# Patient Record
Sex: Female | Born: 2000
Health system: Southern US, Community
[De-identification: ages and names within clinical notes are randomized; demographics above are authoritative.]

## PROBLEM LIST (undated history)

## (undated) DIAGNOSIS — D802 Selective deficiency of immunoglobulin A [IgA]: Secondary | ICD-10-CM

## (undated) DIAGNOSIS — K900 Celiac disease: Secondary | ICD-10-CM

## (undated) DIAGNOSIS — J45909 Unspecified asthma, uncomplicated: Secondary | ICD-10-CM

## (undated) DIAGNOSIS — L309 Dermatitis, unspecified: Secondary | ICD-10-CM

## (undated) DIAGNOSIS — T7840XA Allergy, unspecified, initial encounter: Secondary | ICD-10-CM

## (undated) HISTORY — PX: TONSILLECTOMY AND ADENOIDECTOMY: SUR1326

## (undated) HISTORY — PX: ADENOIDECTOMY: SUR15

## (undated) HISTORY — DX: Dermatitis, unspecified: L30.9

---

## 2020-05-02 ENCOUNTER — Encounter (HOSPITAL_COMMUNITY): Payer: Self-pay | Admitting: Emergency Medicine

## 2020-05-02 ENCOUNTER — Ambulatory Visit (HOSPITAL_COMMUNITY)
Admission: EM | Admit: 2020-05-02 | Discharge: 2020-05-02 | Disposition: A | Discharge status: Home / Self Care | Payer: 59

## 2020-05-02 ENCOUNTER — Other Ambulatory Visit: Payer: Self-pay

## 2020-05-02 ENCOUNTER — Ambulatory Visit (HOSPITAL_COMMUNITY): Admit: 2020-05-02 | Discharge status: Absent without leave | Payer: Self-pay | Source: Home / Self Care

## 2020-05-02 DIAGNOSIS — N921 Excessive and frequent menstruation with irregular cycle: Secondary | ICD-10-CM

## 2020-05-02 DIAGNOSIS — G43109 Migraine with aura, not intractable, without status migrainosus: Secondary | ICD-10-CM | POA: Diagnosis not present

## 2020-05-02 DIAGNOSIS — H6983 Other specified disorders of Eustachian tube, bilateral: Secondary | ICD-10-CM

## 2020-05-02 HISTORY — DX: Unspecified asthma, uncomplicated: J45.909

## 2020-05-02 HISTORY — DX: Celiac disease: K90.0

## 2020-05-02 HISTORY — DX: Selective deficiency of immunoglobulin a (iga): D80.2

## 2020-05-02 HISTORY — DX: Allergy, unspecified, initial encounter: T78.40XA

## 2020-05-02 MED ORDER — PREDNISONE 50 MG PO TABS: ORAL_TABLET | ORAL | 0 refills | Status: DC

## 2020-05-02 MED ORDER — PROPRANOLOL HCL 10 MG PO TABS: 10.0000 mg | ORAL_TABLET | Freq: Two times a day (BID) | ORAL | 0 refills | Status: DC

## 2020-05-02 NOTE — ED Triage Notes (Addendum)
Pt c/o right ear pain and itching x 3 days ago. Pt states it feels like it is popping like on an airplane. Pt also states she is having migraines and dizziness before each period and heavy bleeding. She is not currently on her period but is unable to see her PCP until the end of October. Patient states she has the Nexpl[place] implant that was placed in May.

## 2020-05-02 NOTE — ED Provider Notes (Signed)
[facility]    CSN: [id number] Arrival date & time: 05/02/20  1521      History   Chief Complaint Chief Complaint  Patient presents with  . Otalgia  . Menstrual Problem    HPI Barbara Torres is a 19 y.o. female.   Feels popping and pressure and itching in the right ear x 3 days. So far has tried a dose of benadryl and sudafed without relief. Does have a hx of seasonal allergies.   Had the nexplanon placed back in April which initially took her menstrual cycle away until last month when she had severe migraine with aura, dizziness and a heavy cycle several days later. States migraines started when she started menstruating, estrogen birth control makes them much worse. Has tried excedrin and triptans without much relief and worse side effects than the migraine. Right now having migraines about twice a month.       Past Medical History:  Diagnosis Date  . Allergies   . Asthma   . Celiac disease   . IgA deficiency (HCC)     There are no problems to display for this patient.   Past Surgical History:  Procedure Laterality Date  . TONSILLECTOMY AND ADENOIDECTOMY      OB History   No obstetric history on file.      Home Medications    Prior to Admission medications   Medication Sig Start Date End Date Taking? Authorizing Provider  cetirizine (ZYRTEC) 10 MG tablet Take 10 mg by mouth daily.   Yes [provider]  fluticasone (FLONASE) 50 MCG/ACT nasal spray Place into both nostrils daily.   Yes [provider]  montelukast (SINGULAIR) 10 MG tablet Take 10 mg by mouth at bedtime.   Yes [provider]  predniSONE (DELTASONE) 50 MG tablet Take 1 tab daily with breakfast for 3 days 05/02/20   Particia Nearing, PA-C  propranolol (INDERAL) 10 MG tablet Take 1 tablet (10 mg total) by mouth 2 (two) times daily. 05/02/20   Particia Nearing, PA-C    Family History Family History  Problem Relation Age of Onset  . Healthy  Mother   . Celiac disease Father     Social History Social History   Tobacco Use  . Smoking status: Never Smoker  . Smokeless tobacco: Never Used  Vaping Use  . Vaping Use: Never used  Substance Use Topics  . Alcohol use: Never  . Drug use: Not on file     Allergies   Sulfa antibiotics   Review of Systems Review of Systems PER HPI    Physical Exam Triage Vital Signs ED Triage Vitals  Enc Vitals Group     BP 05/02/20 1708 135/83     Pulse Rate 05/02/20 1708 80     Resp 05/02/20 1708 16     Temp 05/02/20 1708 99 F (37.2 C)     Temp Source 05/02/20 1708 Oral     SpO2 05/02/20 1708 98 %     Weight --      Height --      Head Circumference --      Peak Flow --      Pain Score 05/02/20 1703 4     Pain Loc --      Pain Edu? --      Excl. in GC? --    No data found.  Updated Vital Signs BP 135/83 (BP Location: Left Arm)   Pulse 80   Temp 99  F (37.2 C) (Oral)   Resp 16   LMP 04/13/2020   SpO2 98%   Visual Acuity Right Eye Distance:   Left Eye Distance:   Bilateral Distance:    Right Eye Near:   Left Eye Near:    Bilateral Near:     Physical Exam Vitals and nursing note reviewed.  Constitutional:      Appearance: Normal appearance. She is not ill-appearing.  HENT:     Head: Atraumatic.     Ears:     Comments: B/l clear effusion middle ears    Nose:     Comments: Nasal mucosa boggy and erythematous    Mouth/Throat:     Mouth: Mucous membranes are moist.     Pharynx: Posterior oropharyngeal erythema present.  Eyes:     Extraocular Movements: Extraocular movements intact.     Conjunctiva/sclera: Conjunctivae normal.  Cardiovascular:     Rate and Rhythm: Normal rate and regular rhythm.     Heart sounds: Normal heart sounds.  Pulmonary:     Effort: Pulmonary effort is normal.     Breath sounds: Normal breath sounds.  Abdominal:     General: Bowel sounds are normal. There is no distension.     Palpations: Abdomen is soft.     Tenderness:  There is no abdominal tenderness. There is no guarding.  Musculoskeletal:        General: Normal range of motion.     Cervical back: Normal range of motion and neck supple.  Skin:    General: Skin is warm and dry.  Neurological:     General: No focal deficit present.     Mental Status: She is alert and oriented to person, place, and time.  Psychiatric:        Mood and Affect: Mood normal.        Thought Content: Thought content normal.        Judgment: Judgment normal.      UC Treatments / Results  Labs (all labs ordered are listed, but only abnormal results are displayed) Labs Reviewed - No data to display  EKG   Radiology No results found.  Procedures Procedures (including critical care time)  Medications Ordered in UC Medications - No data to display  Initial Impression / Assessment and Plan / UC Course  I have reviewed the triage vital signs and the nursing notes.  Pertinent labs & imaging results that were available during my care of the patient were reviewed by me and considered in my medical decision making (see chart for details).     Eustachian tube dysfunction - continue good allergy regimen as before, add sudafed several times daily prn until sxs improve. Prednisone burst sent in case discomfort worsening or sudafed not resolving issue. May need further allergy control if sxs not becoming better controlled in future.   Discussed multiple migraine medication options. Does not tolerate prn options well. Agreeable to trying low dose propranolol either daily as written or on prn basis until her f/u with PCP next month. Discussed watching heart rates and to stop the medication if becoming fatigued, dizzy or passing out.   Final Clinical Impressions(s) / UC Diagnoses   Final diagnoses:  Menorrhagia with irregular cycle  Migraine with aura and without status migrainosus, not intractable  Eustachian tube dysfunction, bilateral   Discharge Instructions   None      ED Prescriptions    Medication Sig Dispense Auth. Provider   propranolol (INDERAL) 10 MG tablet Take 1  tablet (10 mg total) by mouth 2 (two) times daily. 60 tablet Roosvelt Maser [place], [place]   predniSONE (DELTASONE) 50 MG tablet Take 1 tab daily with breakfast for 3 days 3 tablet Particia Nearing, [place]     PDMP not reviewed this encounter.   Particia Nearing, [place] 05/02/20 2010

## 2020-05-28 ENCOUNTER — Ambulatory Visit: Payer: 59 | Admitting: Family Medicine

## 2020-06-02 ENCOUNTER — Other Ambulatory Visit: Payer: Self-pay

## 2020-06-02 ENCOUNTER — Ambulatory Visit (INDEPENDENT_AMBULATORY_CARE_PROVIDER_SITE_OTHER): Payer: 59 | Admitting: Family Medicine

## 2020-06-02 ENCOUNTER — Encounter: Payer: Self-pay | Admitting: Family Medicine

## 2020-06-02 VITALS — BP 114/70 | HR 86 | Ht 67.0 in | Wt 142.1 lb

## 2020-06-02 DIAGNOSIS — D802 Selective deficiency of immunoglobulin A [IgA]: Secondary | ICD-10-CM | POA: Insufficient documentation

## 2020-06-02 DIAGNOSIS — Z9109 Other allergy status, other than to drugs and biological substances: Secondary | ICD-10-CM | POA: Diagnosis not present

## 2020-06-02 DIAGNOSIS — Z23 Encounter for immunization: Secondary | ICD-10-CM

## 2020-06-02 DIAGNOSIS — K900 Celiac disease: Secondary | ICD-10-CM | POA: Insufficient documentation

## 2020-06-02 DIAGNOSIS — Z7689 Persons encountering health services in other specified circumstances: Secondary | ICD-10-CM

## 2020-06-02 DIAGNOSIS — G43109 Migraine with aura, not intractable, without status migrainosus: Secondary | ICD-10-CM | POA: Diagnosis not present

## 2020-06-02 MED ORDER — FLUTICASONE PROPIONATE 50 MCG/ACT NA SUSP: 2.0000 | Freq: Two times a day (BID) | NASAL | 2 refills | Status: DC

## 2020-06-02 NOTE — Assessment & Plan Note (Addendum)
Occur exclusively with menstrual cycle. Currently well-controlled with Propranolol 10mg  BID and Nexplanon. -Will discontinue Propranolol per patient's request -Will try Excedrin and Ibuprofen at the first sign of a migraine -Consider referral to headache center in the future as needed, since patient unable to tolerate triptans

## 2020-06-02 NOTE — Progress Notes (Signed)
    SUBJECTIVE:   CHIEF COMPLAINT / HPI:   Establish Care Patient presents to establish care. She was previously followed at [facility] in [place]. Patient recently moved to the area in August 2021 for grad school at [employer] where she is studying speech language pathology.  Health maintenance:  -Has never had Pap smear. Has never been sexually active -Would like to receive flu vaccine today -UTD on all other vaccinations, including COVID vaccine   Migraines Patient has a long hx of migraine with aura since ~2014 when she started menstruating. Patient's migraines had been occuring every month with her menstrual cycle until May 2021 when she had a Nexplanon placed. She has only gotten one period since the Nexplanon placement, and therefore has only had one migraine. She takes Propranolol 10mg  BID for prophylaxis but wishes to get off this medication since her periods/migraines are infrequent now.  Patient was previously followed by a neurologist and has tried sumatriptin, zolmitriptan, and rizatriptan, but these gave her significant neck pain. Patient states the triptans did relieve her migraines but the neck pain felt like an equal exchange. She has tried Excedrin as soon as the aura starts, which provides slight relief. Patient states she'll still have a migraine but it will be somewhat less severe.  PERTINENT  PMH / PSH: Migraine with aura, IgA deficiency, allergies, celiac disease  OBJECTIVE:   BP 114/70   Pulse 86   Ht 5\' 7"  (1.702 m)   Wt 142 lb 2 oz (64.5 kg)   LMP 04/21/2020   SpO2 99%   BMI 22.26 kg/m   Physical Exam Vitals reviewed.  Constitutional:      Appearance: Normal appearance.  HENT:     Mouth/Throat:     Mouth: Mucous membranes are moist.     Pharynx: No oropharyngeal exudate or posterior oropharyngeal erythema.  Eyes:     Pupils: Pupils are equal, round, and reactive to light.  Cardiovascular:     Rate and Rhythm: Normal rate and regular rhythm.       Heart sounds: Normal heart sounds. No murmur heard.   Pulmonary:     Effort: Pulmonary effort is normal.     Breath sounds: Normal breath sounds.  Musculoskeletal:     Cervical back: Neck supple.  Neurological:     Mental Status: She is alert and oriented to person, place, and time.     ASSESSMENT/PLAN:   Migraine with aura Occur exclusively with menstrual cycle. Currently well-controlled with Propranolol 10mg  BID and Nexplanon. -Will discontinue Propranolol per patient's request -Will try Excedrin and Ibuprofen at the first sign of a migraine -Consider referral to headache center in the future as needed, since patient unable to tolerate triptans  Health Maintenance Flu vaccine given today Patient will fax records from prior PCP [facility] Pap as patient has never been sexually active  Allergies Refills provided for Flonase   Discussed case with Dr. , MD [facility]

## 2020-06-02 NOTE — Patient Instructions (Addendum)
It was great to meet you!  Our plans for today:  - STOP taking your propanolol - Take 600mg  Ibuprofen every 8 hours and Excedrin at the first sign of a migraine - If this is not working for you, feel free to call or send a mychart message and we can consider a referral to the headache center   Take care and seek immediate care sooner if you develop any concerns.    Dr. Family Medicine

## 2020-06-27 ENCOUNTER — Ambulatory Visit (HOSPITAL_COMMUNITY)
Admission: EM | Admit: 2020-06-27 | Discharge: 2020-06-27 | Disposition: A | Discharge status: Home / Self Care | Payer: 59 | Attending: Family Medicine | Admitting: Family Medicine

## 2020-06-27 ENCOUNTER — Ambulatory Visit (HOSPITAL_COMMUNITY): Admit: 2020-06-27 | Discharge status: Absent without leave | Payer: 59

## 2020-06-27 ENCOUNTER — Encounter (HOSPITAL_COMMUNITY): Payer: Self-pay

## 2020-06-27 ENCOUNTER — Ambulatory Visit (INDEPENDENT_AMBULATORY_CARE_PROVIDER_SITE_OTHER): Payer: 59

## 2020-06-27 DIAGNOSIS — M79644 Pain in right finger(s): Secondary | ICD-10-CM | POA: Diagnosis not present

## 2020-06-27 DIAGNOSIS — S6710XA Crushing injury of unspecified finger(s), initial encounter: Secondary | ICD-10-CM | POA: Diagnosis not present

## 2020-06-27 DIAGNOSIS — S6010XA Contusion of unspecified finger with damage to nail, initial encounter: Secondary | ICD-10-CM

## 2020-06-27 DIAGNOSIS — S6991XA Unspecified injury of right wrist, hand and finger(s), initial encounter: Secondary | ICD-10-CM | POA: Diagnosis not present

## 2020-06-27 DIAGNOSIS — W231XXA Caught, crushed, jammed, or pinched between stationary objects, initial encounter: Secondary | ICD-10-CM | POA: Diagnosis not present

## 2020-06-27 NOTE — ED Provider Notes (Signed)
[facility]    CSN: [id number] Arrival date & time: 06/27/20  1528      History   Chief Complaint Chief Complaint  Patient presents with  . Finger Injury    HPI Prabhleen Montemayor is a 19 y.o. female.   Patient presenting today with right pinky pain, bruising under nail after slamming the tip of her finger into a car door 2 days ago. She states she has been having swelling, some mild numbness, and has trouble moving the distal portion of the finger. Using ice, ibuprofen which has been helping some with sxs. Denies cool extremity, swelling down into lower portion of finger or hand, disruption of nail.      Past Medical History:  Diagnosis Date  . Allergies   . Asthma   . Celiac disease   . IgA deficiency [facility])     Patient Active Problem List   Diagnosis Date Noted  . Migraine with aura 06/02/2020  . Environmental allergies 06/02/2020  . IgA deficiency (HCC) 06/02/2020  . Celiac disease 06/02/2020    Past Surgical History:  Procedure Laterality Date  . TONSILLECTOMY AND ADENOIDECTOMY      OB History   No obstetric history on file.      Home Medications    Prior to Admission medications   Medication Sig Start Date End Date Taking? Authorizing Provider  cetirizine (ZYRTEC) 10 MG tablet Take 10 mg by mouth daily.   Yes [provider]  fluticasone (FLONASE) 50 MCG/ACT nasal spray Place 2 sprays into both nostrils 2 (two) times daily. 06/02/20  Yes Maury Dus, MD  montelukast (SINGULAIR) 10 MG tablet Take 10 mg by mouth at bedtime.   Yes [provider]  propranolol (INDERAL) 10 MG tablet Take 1 tablet (10 mg total) by mouth 2 (two) times daily. 05/02/20   Particia Nearing, PA-C    Family History Family History  Problem Relation Age of Onset  . Healthy Mother   . Celiac disease Father     Social History Social History   Tobacco Use  . Smoking status: Never Smoker  . Smokeless tobacco: Never Used  Vaping Use  .  Vaping Use: Never used  Substance Use Topics  . Alcohol use: Never  . Drug use: Not on file     Allergies   Sulfa antibiotics   Review of Systems Review of Systems PER HPI    Physical Exam Triage Vital Signs ED Triage Vitals  Enc Vitals Group     BP 06/27/20 1602 114/72     Pulse Rate 06/27/20 1602 85     Resp 06/27/20 1602 16     Temp 06/27/20 1602 99.8 F (37.7 C)     Temp Source 06/27/20 1602 Oral     SpO2 06/27/20 1602 99 %     Weight --      Height --      Head Circumference --      Peak Flow --      Pain Score 06/27/20 1601 2     Pain Loc --      Pain Edu? --      Excl. in GC? --    No data found.  Updated Vital Signs BP 114/72 (BP Location: Left Arm)   Pulse 85   Temp 99.8 F (37.7 C) (Oral)   Resp 16   SpO2 99%   Visual Acuity Right Eye Distance:   Left Eye Distance:   Bilateral Distance:    Right  Eye Near:   Left Eye Near:    Bilateral Near:     Physical Exam Vitals and nursing note reviewed.  Constitutional:      Appearance: Normal appearance. She is not ill-appearing.  HENT:     Head: Atraumatic.  Eyes:     Extraocular Movements: Extraocular movements intact.     Conjunctiva/sclera: Conjunctivae normal.  Cardiovascular:     Rate and Rhythm: Normal rate and regular rhythm.     Heart sounds: Normal heart sounds.     Comments: Cap refill < 2 seconds right hand all digits Pulmonary:     Effort: Pulmonary effort is normal.     Breath sounds: Normal breath sounds.  Musculoskeletal:        General: Swelling, tenderness (ttp distal 5th digit right hand) and signs of injury present. No deformity.     Cervical back: Normal range of motion and neck supple.     Comments: ROM intact right 5th digit though stiff at DIP   Skin:    General: Skin is warm and dry.     Findings: Bruising (subungual hematoma present right 5th fingernail) present.  Neurological:     Mental Status: She is alert and oriented to person, place, and time.      Sensory: Sensory deficit (mild decreased sensation to light touch right 5th digit distally at site of injury) present.     Comments: Right hand neurovascularly intact  Psychiatric:        Mood and Affect: Mood normal.        Thought Content: Thought content normal.        Judgment: Judgment normal.    UC Treatments / Results  Labs (all labs ordered are listed, but only abnormal results are displayed) Labs Reviewed - No data to display  EKG   Radiology DG Finger Little Right  Result Date: 06/27/2020 CLINICAL DATA:  Patient states that she injured her right 5th digit in a car door x 3 days ago. Pain along distal end of finger. No Hx EXAM: RIGHT LITTLE FINGER 2+V COMPARISON:  None. FINDINGS: There is no evidence of fracture or dislocation. There is no evidence of arthropathy or other focal bone abnormality. Soft tissues are unremarkable. IMPRESSION: Negative. Electronically Signed   By: Emmaline Kluver M.D.   On: 06/27/2020 16:33    Procedures Procedures (including critical care time)  Medications Ordered in UC Medications - No data to display  Initial Impression / Assessment and Plan / UC Course  I have reviewed the triage vital signs and the nursing notes.  Pertinent labs & imaging results that were available during my care of the patient were reviewed by me and considered in my medical decision making (see chart for details).     X-ray negative for fracture, nail fully intact. Given age and mild severity of hematoma draining the area is not indicated at this time. Discussed ice, elevation, NSAIDs. Return precautions given.  Final Clinical Impressions(s) / UC Diagnoses   Final diagnoses:  Subungual hematoma of digit of hand, initial encounter  Crushing injury of finger, initial encounter   Discharge Instructions   None    ED Prescriptions    None     PDMP not reviewed this encounter.   Particia Nearing, [place] 06/27/20 1659

## 2020-06-27 NOTE — ED Triage Notes (Signed)
Pt c/o pain to right 5th finger s/p slamming it in a car door on Friday.  Unable to flex finger at distal joint, nailbed with blue/purple blood matter trapped. C/o numbness to finger.  Denies further injury. Took 400mg  ibuprofen at 1200.

## 2020-07-30 ENCOUNTER — Other Ambulatory Visit: Payer: Self-pay

## 2020-07-30 ENCOUNTER — Ambulatory Visit (HOSPITAL_COMMUNITY)
Admission: EM | Admit: 2020-07-30 | Discharge: 2020-07-30 | Disposition: A | Discharge status: Home / Self Care | Payer: BC Managed Care – PPO | Attending: Emergency Medicine | Admitting: Emergency Medicine

## 2020-07-30 ENCOUNTER — Encounter (HOSPITAL_COMMUNITY): Payer: Self-pay

## 2020-07-30 DIAGNOSIS — R21 Rash and other nonspecific skin eruption: Secondary | ICD-10-CM | POA: Diagnosis not present

## 2020-07-30 DIAGNOSIS — W57XXXA Bitten or stung by nonvenomous insect and other nonvenomous arthropods, initial encounter: Secondary | ICD-10-CM

## 2020-07-30 DIAGNOSIS — S90869A Insect bite (nonvenomous), unspecified foot, initial encounter: Secondary | ICD-10-CM | POA: Diagnosis not present

## 2020-07-30 MED ORDER — HYDROXYZINE HCL 25 MG PO TABS: 25.0000 mg | ORAL_TABLET | Freq: Four times a day (QID) | ORAL | 0 refills | Status: DC

## 2020-07-30 MED ORDER — PREDNISONE 10 MG (21) PO TBPK: ORAL_TABLET | Freq: Every day | ORAL | 0 refills | Status: DC

## 2020-07-30 NOTE — Discharge Instructions (Addendum)
Avoid scratching  May use otc anti itching cream as needed  Do not take benadryl and hydroxyzine together or while drinking

## 2020-07-30 NOTE — ED Provider Notes (Signed)
[facility]    CSN: [id number] Arrival date & time: 07/30/20  0857      History   Chief Complaint Chief Complaint  Patient presents with  . Insect Bite    HPI Barbara Torres is a 19 y.o. female.   [place] and noticed bite marks to bil arms and feet a few days ago. Has tried otc meds with no relief of itching.      Past Medical History:  Diagnosis Date  . Allergies   . Asthma   . Celiac disease   . IgA deficiency [facility])     Patient Active Problem List   Diagnosis Date Noted  . Migraine with aura 06/02/2020  . Environmental allergies 06/02/2020  . IgA deficiency (HCC) 06/02/2020  . Celiac disease 06/02/2020    Past Surgical History:  Procedure Laterality Date  . TONSILLECTOMY AND ADENOIDECTOMY      OB History   No obstetric history on file.      Home Medications    Prior to Admission medications   Medication Sig Start Date End Date Taking? Authorizing Provider  cetirizine (ZYRTEC) 10 MG tablet Take 10 mg by mouth daily.   Yes [provider]  fluticasone (FLONASE) 50 MCG/ACT nasal spray Place 2 sprays into both nostrils 2 (two) times daily. 06/02/20  Yes Maury Dus, MD  montelukast (SINGULAIR) 10 MG tablet Take 10 mg by mouth at bedtime.   Yes [provider]  hydrOXYzine (ATARAX/VISTARIL) 25 MG tablet Take 1 tablet (25 mg total) by mouth every 6 (six) hours. 07/30/20   Coralyn Mark, NP  predniSONE (STERAPRED UNI-PAK 21 TAB) 10 MG (21) TBPK tablet Take by mouth daily. Take 6 tabs by mouth daily  for 2 days, then 5 tabs for 2 days, then 4 tabs for 2 days, then 3 tabs for 2 days, 2 tabs for 2 days, then 1 tab by mouth daily for 2 days 07/30/20   Coralyn Mark, NP  propranolol (INDERAL) 10 MG tablet Take 1 tablet (10 mg total) by mouth 2 (two) times daily. 05/02/20   Particia Nearing, PA-C    Family History Family History  Problem Relation Age of Onset  . Healthy Mother   .  Celiac disease Father     Social History Social History   Tobacco Use  . Smoking status: Never Smoker  . Smokeless tobacco: Never Used  Vaping Use  . Vaping Use: Never used  Substance Use Topics  . Alcohol use: Never     Allergies   Sulfa antibiotics   Review of Systems Review of Systems  Constitutional: Negative.   Eyes: Negative.   Respiratory: Negative.   Cardiovascular: Negative.   Skin: Positive for rash.  Neurological: Negative.      Physical Exam Triage Vital Signs ED Triage Vitals  Enc Vitals Group     BP 07/30/20 1017 99/67     Pulse Rate 07/30/20 1017 76     Resp 07/30/20 1017 16     Temp 07/30/20 1017 98.4 F (36.9 C)     Temp Source 07/30/20 1017 Oral     SpO2 07/30/20 1017 99 %     Weight --      Height --      Head Circumference --      Peak Flow --      Pain Score 07/30/20 1016 0     Pain Loc --      Pain Edu? --  Excl. in GC? --    No data found.  Updated Vital Signs BP 99/67 (BP Location: Right Arm)   Pulse 76   Temp 98.4 F (36.9 C) (Oral)   Resp 16   SpO2 99%   Visual Acuity     Physical Exam Constitutional:      Appearance: Normal appearance.  Cardiovascular:     Rate and Rhythm: Normal rate.  Abdominal:     General: Abdomen is flat.  Skin:    Findings: Rash present.     Comments: Small pin point bite / flea like marks to bil arms and legs. No drainage,   Neurological:     General: No focal deficit present.     Mental Status: She is alert.      UC Treatments / Results  Labs (all labs ordered are listed, but only abnormal results are displayed) Labs Reviewed - No data to display  EKG   Radiology No results found.  Procedures Procedures (including critical care time)  Medications Ordered in UC Medications - No data to display  Initial Impression / Assessment and Plan / UC Course  I have reviewed the triage vital signs and the nursing notes.  Pertinent labs & imaging results that were available  during my care of the patient were reviewed by me and considered in my medical decision making (see chart for details).     Avoid scratching  May use otc anti itching cream as needed  Do not take benadryl and hydroxyzine together or while drinking  Final Clinical Impressions(s) / UC Diagnoses   Final diagnoses:  Insect bite of foot, unspecified laterality, initial encounter  Rash and nonspecific skin eruption     Discharge Instructions     Avoid scratching  May use otc anti itching cream as needed  Do not take benadryl and hydroxyzine together or while drinking      ED Prescriptions    Medication Sig Dispense Auth. Provider   hydrOXYzine (ATARAX/VISTARIL) 25 MG tablet Take 1 tablet (25 mg total) by mouth every 6 (six) hours. 12 tablet Maple Mirza L, NP   predniSONE (STERAPRED UNI-PAK 21 TAB) 10 MG (21) TBPK tablet Take by mouth daily. Take 6 tabs by mouth daily  for 2 days, then 5 tabs for 2 days, then 4 tabs for 2 days, then 3 tabs for 2 days, 2 tabs for 2 days, then 1 tab by mouth daily for 2 days 42 tablet Coralyn Mark, NP     PDMP not reviewed this encounter.   Coralyn Mark, NP 07/30/20 1425

## 2020-07-30 NOTE — ED Triage Notes (Signed)
Pt presents with bug bites that are itchy X 1 week. Pt states last Wednesday she had bug bites on her legs and arms. Pt states this past Wednesday she noticed more bug bites on her legs and arms and buttocks.

## 2020-10-01 ENCOUNTER — Encounter: Payer: Self-pay | Admitting: Family Medicine

## 2020-10-01 ENCOUNTER — Ambulatory Visit (INDEPENDENT_AMBULATORY_CARE_PROVIDER_SITE_OTHER): Payer: BC Managed Care – PPO | Admitting: Family Medicine

## 2020-10-01 ENCOUNTER — Other Ambulatory Visit: Payer: Self-pay

## 2020-10-01 VITALS — BP 104/62 | HR 88 | Ht 67.0 in | Wt 144.6 lb

## 2020-10-01 DIAGNOSIS — H04123 Dry eye syndrome of bilateral lacrimal glands: Secondary | ICD-10-CM | POA: Diagnosis not present

## 2020-10-01 DIAGNOSIS — H04129 Dry eye syndrome of unspecified lacrimal gland: Secondary | ICD-10-CM | POA: Insufficient documentation

## 2020-10-01 DIAGNOSIS — K120 Recurrent oral aphthae: Secondary | ICD-10-CM

## 2020-10-01 MED ORDER — TRIAMCINOLONE ACETONIDE 0.1 % MT PSTE: 1.0000 "application " | PASTE | Freq: Two times a day (BID) | OROMUCOSAL | 12 refills | Status: AC

## 2020-10-01 NOTE — Assessment & Plan Note (Signed)
Symptoms are temporally related to gluten exposure. No lesions. Mucous membranes moist.  - Will send in kenalog with orajel for prn use

## 2020-10-01 NOTE — Progress Notes (Addendum)
    SUBJECTIVE:   CHIEF COMPLAINT / HPI:   Dry Eyes: Patient has a several year history of dry eyes. She was worked up for this by her PCP in [address] [facility], [place], [place]) for autoimmune causes and workup was negative. She says that she has been to an optometrist and has tried "every over-the-counter eye drop" with no relief in symptoms. She denies any visual changes or redness. She denies any rashes or joint pain. Denies vaginal dryness/itching or dry mouth.   Apthous Stomatitis:  She reports a recurrent history of canker sores that are usually brought on by exposure to gluten. She usually gets more than one at a time and they last 10-14 days. She has been using Orajel with moderate relief in pain, but is wondering if we can offer something to help them heal faster.   PERTINENT  PMH / PSH: Celiac Disease; Migraine with aura (off ppx)   OBJECTIVE:   BP 104/62   Pulse 88   Ht 5\' 7"  (1.702 m)   Wt 144 lb 9.6 oz (65.6 kg)   SpO2 99%   BMI 22.65 kg/m   Physical Exam Vitals reviewed.  Constitutional:      General: She is not in acute distress.    Appearance: She is normal weight.  HENT:     Mouth/Throat:     Comments: No lesions present, Mucous membranes moist  Eyes:     Comments: Eyes without injection, drainage, or crusting. EOMs intact. No swelling or erythema of periorbital area.   Cardiovascular:     Rate and Rhythm: Normal rate and regular rhythm.     Heart sounds: Normal heart sounds.  Musculoskeletal:        General: No swelling, tenderness or deformity.  Skin:    Comments: No rashes or ulcers.   Neurological:     Mental Status: She is alert.      ASSESSMENT/PLAN:   Dry eye Bilateral chronic dry eyes. Given her history of celiac disease, consider autoimmune cause, but autoimmune workup at her previous PCP office was unrevealing. She has failed OTC therapies. She will likely require prescription drops/gel or further intervention we  cannot offer in this office.  - Consult to opthalmology  Aphthous stomatitis Symptoms are temporally related to gluten exposure. No lesions. Mucous membranes moist.  - Will send in kenalog with orajel for prn use       , Medical Student [facility]   Resident Attestation  I saw and evaluated the patient, performing the key elements of the service.I  personally performed or re-performed the history, physical exam, and medical decision making activities of this service and have verified that the service and findings are accurately documented in the student's note. I developed the management plan that is described in the medical student's note, and I agree with the content, with my edits above.    [facility], PGY2

## 2020-10-01 NOTE — Patient Instructions (Addendum)
Burnie,  It was a pleasure taking care of you today. We are referring you to ophthalmology for your dry eyes. They should reach out to you within the next week or two to set up that appointment. If you have not heard from them in two weeks, please reach back out to [place].  For your canker sores, we are prescribing you an oral paste that is a combination of steroids and Orajel. This should clear up your symptoms much more quickly than Orajel alone.   Fredric Mare and Dr. Nobie Putnam

## 2020-10-01 NOTE — Assessment & Plan Note (Signed)
Bilateral chronic dry eyes. Given her history of celiac disease, consider autoimmune cause, but autoimmune workup at her previous PCP office was unrevealing. She has failed OTC therapies. She will likely require prescription drops/gel or further intervention we cannot offer in this office.  - Consult to opthalmology

## 2020-10-15 ENCOUNTER — Telehealth: Payer: Self-pay | Admitting: *Deleted

## 2020-10-15 NOTE — Telephone Encounter (Signed)
Patient called asking for an update on her eye appt.  Called and spoke with [facility] and they never received the fax.  Resent her information to them via epic and they will call patient to schedule this appointment.  Please call her and let her know.  She can call them to make this appt too.     [address], [place], [place] [address] Phone: [phone]

## 2020-10-18 DIAGNOSIS — H16223 Keratoconjunctivitis sicca, not specified as Sjogren's, bilateral: Secondary | ICD-10-CM | POA: Diagnosis not present

## 2020-10-18 NOTE — Telephone Encounter (Signed)
[facility] called and scheduled an appt with Pt. Barbara Torres, CMA

## 2020-10-27 ENCOUNTER — Other Ambulatory Visit: Payer: Self-pay | Admitting: Family Medicine

## 2020-10-27 DIAGNOSIS — Z9109 Other allergy status, other than to drugs and biological substances: Secondary | ICD-10-CM

## 2020-10-29 ENCOUNTER — Encounter (HOSPITAL_COMMUNITY): Payer: Self-pay

## 2020-10-29 ENCOUNTER — Ambulatory Visit (HOSPITAL_COMMUNITY)
Admission: EM | Admit: 2020-10-29 | Discharge: 2020-10-29 | Disposition: A | Discharge status: Home / Self Care | Payer: BC Managed Care – PPO | Attending: Medical Oncology | Admitting: Medical Oncology

## 2020-10-29 ENCOUNTER — Other Ambulatory Visit: Payer: Self-pay

## 2020-10-29 DIAGNOSIS — H6593 Unspecified nonsuppurative otitis media, bilateral: Secondary | ICD-10-CM | POA: Diagnosis not present

## 2020-10-29 DIAGNOSIS — H60501 Unspecified acute noninfective otitis externa, right ear: Secondary | ICD-10-CM | POA: Diagnosis not present

## 2020-10-29 MED ORDER — FLUTICASONE PROPIONATE 50 MCG/ACT NA SUSP: 2.0000 | Freq: Every day | NASAL | 0 refills | Status: AC

## 2020-10-29 MED ORDER — OFLOXACIN 0.3 % OT SOLN: 5.0000 [drp] | Freq: Every day | OTIC | 0 refills | Status: DC

## 2020-10-29 NOTE — ED Provider Notes (Signed)
[facility]    CSN: [id number] Arrival date & time: 10/29/20  1235      History   Chief Complaint Chief Complaint  Patient presents with  . Otalgia  . Ear Fullness    HPI Barbara Torres is a 20 y.o. female.   HPI   Ear Fullness: Pt reports a 1 week history of bilateral ear fullness and pain.  She said that symptoms started with cold symptoms such as a runny nose and cough however the symptoms have resolved and the ear fullness and pain has continued.  No recent fever, hearing trouble, significant decrease in hearing.  She thinks she may have some slight discharge out of the right ear but other than this no significant discharge.  She has been using Mucinex for symptoms with some relief.  Past Medical History:  Diagnosis Date  . Allergies   . Asthma   . Celiac disease   . IgA deficiency [facility])     Patient Active Problem List   Diagnosis Date Noted  . Dry eye 10/01/2020  . Aphthous stomatitis 10/01/2020  . Migraine with aura 06/02/2020  . Environmental allergies 06/02/2020  . IgA deficiency (HCC) 06/02/2020  . Celiac disease 06/02/2020    Past Surgical History:  Procedure Laterality Date  . TONSILLECTOMY AND ADENOIDECTOMY      OB History   No obstetric history on file.      Home Medications    Prior to Admission medications   Medication Sig Start Date End Date Taking? Authorizing Provider  cetirizine (ZYRTEC) 10 MG tablet Take 10 mg by mouth daily.    [provider]  fluticasone (FLONASE) 50 MCG/ACT nasal spray PLACE 2 SPRAYS INTO BOTH NOSTRILS 2 (TWO) TIMES DAILY 10/27/20   Maury Dus, MD  hydrOXYzine (ATARAX/VISTARIL) 25 MG tablet Take 1 tablet (25 mg total) by mouth every 6 (six) hours. Patient not taking: Reported on 10/01/2020 07/30/20   Coralyn Mark, NP  montelukast (SINGULAIR) 10 MG tablet Take 10 mg by mouth at bedtime.    [provider]  predniSONE (STERAPRED UNI-PAK 21 TAB) 10 MG (21) TBPK tablet Take by  mouth daily. Take 6 tabs by mouth daily  for 2 days, then 5 tabs for 2 days, then 4 tabs for 2 days, then 3 tabs for 2 days, 2 tabs for 2 days, then 1 tab by mouth daily for 2 days Patient not taking: Reported on 10/01/2020 07/30/20   Coralyn Mark, NP  propranolol (INDERAL) 10 MG tablet Take 1 tablet (10 mg total) by mouth 2 (two) times daily. Patient not taking: Reported on 10/01/2020 05/02/20   Particia Nearing, PA-C  triamcinolone (KENALOG) 0.1 % paste Use as directed 1 application in the mouth or throat 2 (two) times daily. 10/01/20   Derrel Nip, MD    Family History Family History  Problem Relation Age of Onset  . Healthy Mother   . Celiac disease Father     Social History Social History   Tobacco Use  . Smoking status: Never Smoker  . Smokeless tobacco: Never Used  Vaping Use  . Vaping Use: Never used  Substance Use Topics  . Alcohol use: Never     Allergies   Sulfa antibiotics   Review of Systems Review of Systems  As stated above in HPI Physical Exam Triage Vital Signs ED Triage Vitals  Enc Vitals Group     BP 10/29/20 1302 (!) 114/56     Pulse Rate 10/29/20 1300 80  Resp 10/29/20 1300 18     Temp 10/29/20 1300 99 F (37.2 C)     Temp Source 10/29/20 1300 Oral     SpO2 10/29/20 1300 99 %     Weight --      Height --      Head Circumference --      Peak Flow --      Pain Score 10/29/20 1259 5     Pain Loc --      Pain Edu? --      Excl. in GC? --    No data found.  Updated Vital Signs BP (!) 114/56 (BP Location: Right Arm)   Pulse 80   Temp 99 F (37.2 C) (Oral)   Resp 18   SpO2 99%   Physical Exam Vitals and nursing note reviewed.  Constitutional:      General: She is not in acute distress.    Appearance: Normal appearance. She is not ill-appearing, toxic-appearing or diaphoretic.  HENT:     Head: Normocephalic and atraumatic.     Ears:     Comments: Moderate clear middle ear effusion bilaterally. Right canal with mild  edema and scant Sather/yellow discharge    Nose: Nose normal.     Mouth/Throat:     Mouth: Mucous membranes are moist.     Pharynx: Oropharynx is clear. No oropharyngeal exudate or posterior oropharyngeal erythema.  Eyes:     Extraocular Movements: Extraocular movements intact.     Pupils: Pupils are equal, round, and reactive to light.  Cardiovascular:     Rate and Rhythm: Normal rate and regular rhythm.     Heart sounds: Normal heart sounds.  Pulmonary:     Effort: Pulmonary effort is normal.     Breath sounds: Normal breath sounds.  Musculoskeletal:     Cervical back: Normal range of motion and neck supple.  Lymphadenopathy:     Cervical: No cervical adenopathy.  Neurological:     Mental Status: She is alert.      UC Treatments / Results  Labs (all labs ordered are listed, but only abnormal results are displayed) Labs Reviewed - No data to display  EKG   Radiology No results found.  Procedures Procedures (including critical care time)  Medications Ordered in UC Medications - No data to display  Initial Impression / Assessment and Plan / UC Course  I have reviewed the triage vital signs and the nursing notes.  Pertinent labs & imaging results that were available during my care of the patient were reviewed by me and considered in my medical decision making (see chart for details).     New.  Treating with Flonase, ofloxacin and I discussed with her that if symptoms do not improve over the next 3 days that I would have her continue the ofloxacin but switch from the Flonase to from the pharmacist Sudafed or Afrin x 3 days.  Final Clinical Impressions(s) / UC Diagnoses   Final diagnoses:  None   Discharge Instructions   None    ED Prescriptions    None     PDMP not reviewed this encounter.   Rushie Chestnut, [place] 10/29/20 1316

## 2020-10-29 NOTE — ED Triage Notes (Signed)
Pt presents with bilateral ear pain and fullness X 1 week.

## 2020-11-18 ENCOUNTER — Ambulatory Visit (INDEPENDENT_AMBULATORY_CARE_PROVIDER_SITE_OTHER): Payer: BC Managed Care – PPO | Admitting: Allergy

## 2020-11-18 ENCOUNTER — Encounter: Payer: Self-pay | Admitting: Allergy

## 2020-11-18 ENCOUNTER — Other Ambulatory Visit: Payer: Self-pay

## 2020-11-18 VITALS — BP 106/68 | HR 82 | Temp 98.3°F | Resp 17 | Ht 67.25 in | Wt 143.5 lb

## 2020-11-18 DIAGNOSIS — Z8709 Personal history of other diseases of the respiratory system: Secondary | ICD-10-CM | POA: Diagnosis not present

## 2020-11-18 DIAGNOSIS — Z130 Encounter for screening for diseases of the blood and blood-forming organs and certain disorders involving the immune mechanism: Secondary | ICD-10-CM

## 2020-11-18 DIAGNOSIS — J3089 Other allergic rhinitis: Secondary | ICD-10-CM

## 2020-11-18 DIAGNOSIS — T781XXD Other adverse food reactions, not elsewhere classified, subsequent encounter: Secondary | ICD-10-CM | POA: Diagnosis not present

## 2020-11-18 DIAGNOSIS — J4520 Mild intermittent asthma, uncomplicated: Secondary | ICD-10-CM

## 2020-11-18 DIAGNOSIS — H1013 Acute atopic conjunctivitis, bilateral: Secondary | ICD-10-CM

## 2020-11-18 MED ORDER — AZELASTINE HCL 0.1 % NA SOLN: 1.0000 | Freq: Two times a day (BID) | NASAL | 5 refills | Status: AC | PRN

## 2020-11-18 MED ORDER — LEVOCETIRIZINE DIHYDROCHLORIDE 5 MG PO TABS: 5.0000 mg | ORAL_TABLET | Freq: Every evening | ORAL | 5 refills | Status: DC

## 2020-11-18 NOTE — Progress Notes (Signed)
New Patient Note  RE: Barbara Torres MRN: [id number] DOB: 02/18/2001 Date of Office Visit: 11/18/2020  Referring provider: Maury Dus, MD Primary care provider: Maury Dus, MD  Chief Complaint: Asthma and Allergic Rhinitis   History of Present Illness: I had the pleasure of seeing Barbara Torres for initial evaluation at the [facility] on 11/19/2020. She is a 20 y.o. female, who is referred here by Maury Dus, MD for the evaluation of establishing care for asthma, allergies and immunodeficiency.  Patient was seen by allergist in the past for asthma, allergic rhinitis, IgA deficiency and last OV was about 2 years ago.  She moved to this area in August 2021. Records not available as her previous allergists retired.  Asthma: She reports symptoms of chest tightness, shortness of breath, coughing, wheezing, nocturnal awakenings for 20+ years. Current medications include albuterol prn and Singulair which help. She reports not using aerochamber with inhalers. She tried the following inhalers: Advair stopped about 5-6 years ago. Main triggers are allergies, infections, winter months. In the last month, frequency of symptoms: <1x/week. Frequency of nocturnal symptoms: 0x/month. Frequency of SABA use: <1x/week. Interference with physical activity: no. Sleep is undisturbed. In the last 12 months, emergency room visits/urgent care visits/doctor office visits or hospitalizations due to respiratory issues: 1. In the last 12 months, oral steroids courses: one in November. Lifetime history of hospitalization for respiratory issues: as a young child. Prior intubations: no. History of pneumonia: few times. She was evaluated by allergist in the past. Smoking exposure: no. Up to date with flu vaccine: yes.  Up to date with COVID-19 vaccine: yes. No prior COVID-19 diagnosis.  History of reflux: yes but currently asymptomatic.  Allergic rhinitis:  She reports symptoms of itchy  eyes, sore throat, nasal congestion, headache, PND, ear fullness, rhinorrhea, sneezing. Symptoms have been going on for 15+ years. The symptoms are present all year around with worsening from spring through fall. Other triggers include exposure to no. Anosmia: no. Headache: yes. She has used Singulair, zyrtec, Flonase, restasis with fair improvement in symptoms. Sinus infections: yes - 2 per year. Previous work up includes: 2 years ago was positive to trees, grass per patient report. Patient was on allergy injection from age 20 to [age] with unknown benefit.  Previous ENT evaluation: yes in the past, T&A. History of nasal polyps: no. Last eye exam: January.  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 Patient has history multiple infections including sinus infection, pneumonia, ear infections. Denies any GI infections/diarrhea, skin infections/abscesses. Patient has no history of opportunistic infections including fungal infections, viral infections.   Patient labs show immunoglobulin levels IgA 10 a few years ago. Patient reports 2-3 antibiotic use in the last 12 months. Patient does not have any secondary causes of immunodeficiency including chronic steroid use, diabetes mellitus, protein losing enteropathy, renal or hepatic dysfunction, history of cancer or irradiation or history of HIV, hepatitis B or C.  Assessment and Plan: Barbara Torres is a 20 y.o. female with: Mild intermittent asthma without complication Diagnosed with asthma over 20 years ago and currently on Singulair and albuterol as needed with good benefit.  Used to be on Advair 5+ years ago.  Main triggers are allergies, infections and winter months.  1 prednisone course in November 2021.  Today's spirometry was normal. . Daily controller medication(s): none. . During upper respiratory infections/asthma flares: start Alvesco 1 puff twice a day  and rinse mouth afterwards for 1-2 weeks  until your breathing symptoms return to baseline. Sample given. . May use albuterol rescue inhaler 2 puffs every 4 to 6 hours as needed for shortness of breath, chest tightness, coughing, and wheezing. May use albuterol rescue inhaler 2 puffs 5 to 15 minutes prior to strenuous physical activities. Monitor frequency of use.   Other allergic rhinitis Rhinoconjunctivitis symptoms for the past 15 years with worsening from spring through fall.  Skin testing in the past was positive to trees and grass per patient report.  Was on allergy immunotherapy briefly at age 89 with unknown benefit.  Today's skin testing showed: Positive to grass, weed, ragweed, tree, mold, cat, tobacco.  Start environmental control measures as below.  May use over the counter antihistamines such as Zyrtec (cetirizine), Claritin (loratadine), Allegra (fexofenadine), or Xyzal (levocetirizine) daily as needed. May take twice a day during flares.   Continue Singulair (montelukast) 10mg  daily at night.  May use Flonase (fluticasone) nasal spray 1 spray per nostril twice a day as needed for nasal congestion.   May use azelastine nasal spray 1-2 sprays per nostril twice a day as needed for runny nose/drainage.  Nasal saline spray (i.e., Simply Saline) or nasal saline lavage (i.e., NeilMed) is recommended as needed and prior to medicated nasal sprays.  Read about allergy injections - handout given.  Allergic conjunctivitis of both eyes  See assessment and plan as above for allergic rhinitis.  Oral allergy syndrome, subsequent encounter Noted mild facial hives after eating melons, oranges, strawberries, cucumber, tomato sometimes.  Used to have perioral hives after peanut ingestion but now tolerates with no issues.  Diagnosed with celiac's in 2006.  Today's skin testing showed: Negative to tomato, cucumber, orange, strawberry, watermelon.  Borderline to cantaloupe.  Avoid the foods that are bothersome.   Discussed that her  food triggered oral and throat symptoms are likely caused by oral food allergy syndrome (OFAS). This is caused by cross reactivity of pollen with fresh fruits and vegetables, and nuts. Symptoms are usually localized in the form of itching and burning in mouth and throat. Very rarely it can progress to more severe symptoms. Eating foods in cooked or processed forms usually minimizes symptoms. I recommended avoidance of eating the problem foods, especially during the peak season(s). Sometimes, OFAS can induce severe throat swelling or even a systemic reaction; with such instance, I advised them to report to a local ER. A list of common pollens and food cross-reactivities was provided to the patient.   History of frequent upper respiratory infection History of frequent infections including sinusitis, pneumonia and ear infections.  Diagnosed with IgA deficiency at age 89 and last time IgA level was 10.  She is not sure about her other immunoglobulin levels.  Usually requires 2-3 courses of antibiotics in 1 year.  Keep track of infections Get bloodwork as below.    Return in about 2 months (around 01/18/2021).  Meds ordered this encounter  Medications  . levocetirizine (XYZAL) 5 MG tablet    Sig: Take 1 tablet (5 mg total) by mouth every evening.    Dispense:  30 tablet    Refill:  5  . azelastine (ASTELIN) 0.1 % nasal spray    Sig: Place 1-2 sprays into both nostrils 2 (two) times daily as needed (runny nose, drainage). Use in each nostril as directed    Dispense:  30 mL    Refill:  5    Lab Orders     CBC  with Differential/Platelet     IgG, IgA, IgM     Strep pneumoniae 23 Serotypes IgG     Diphtheria / Tetanus Antibody Panel  Other allergy screening: Food allergy:   Patient was diagnosed with celiac's in 2006.  Used to have reactions to peanuts with perioral hives in the past but now tolerates with no issues.  Medication allergy: yes  Sulfa - hives in the past Hymenoptera allergy:  no Urticaria: yes  Sometimes breaks out in hives with melons, oranges, strawberry, cucumber, tomato but not all the times.  Eczema:no  Diagnostics: Spirometry:  Tracings reviewed. Her effort: Good reproducible efforts. FVC: 4.11L FEV1: 3.30L, 92% predicted FEV1/FVC ratio: 80% Interpretation: Spirometry consistent with normal pattern.  Please see scanned spirometry results for details.  Skin Testing: Environmental allergy panel and select foods. Positive to grass, weed, ragweed, tree, mold, cat, tobacco. Negative to tomato, cucumber, orange, strawberry, watermelon.  Borderline to cantaloupe. Results discussed with patient/family.  Airborne Adult Perc - 11/18/20 1416    Time Antigen Placed 1416    Allergen Manufacturer Waynette Buttery    Location Back    Number of Test 59    Panel 1 Select    1. Control-Buffer 50% Glycerol Negative    2. Control-Histamine 1 mg/ml 2+    3. Albumin saline Negative    4. Bahia 4+    5. [place] 4+    6. Johnson 4+    7. [place] Blue 4+    8. Meadow Fescue 4+    9. Perennial Rye 4+    10. Sweet Vernal 3+    11. Timothy 3+    12. Cocklebur Negative    13. Burweed Marshelder Negative    14. Ragweed, short 2+    15. Ragweed, Giant --   +/-   16. Plantain,  English 2+    17. Lamb's Quarters Negative    18. Sheep Sorrell 2+    19. Rough Pigweed Negative    20. Marsh Elder, Rough 2+    21. Mugwort, Common 2+    22. Ash mix Negative    23. Birch mix 2+    24. Beech American 2+    25. Box, Elder 2+    26. Cedar, red Negative    27. Cottonwood, Guinea-Bissau Negative    28. Elm mix Negative    29. Hickory 4+    30. Maple mix 3+    31. Oak, Guinea[place] mix Negative    32. Pecan Pollen 4+    33. Pine mix Negative    34. Sycamore Eastern 2+    35. Walnut, Black Pollen 4+    36. Alternaria alternata Negative    37. Cladosporium Herbarum 2+    38. Aspergillus mix Negative    39. Penicillium mix Negative    40. Bipolaris sorokiniana (Helminthosporium) Negative     41. Drechslera spicifera (Curvularia) Negative    42. Mucor plumbeus Negative    43. Fusarium moniliforme Negative    44. Aureobasidium pullulans (pullulara) Negative    45. Rhizopus oryzae Negative    46. Botrytis cinera Negative    47. Epicoccum nigrum Negative    48. Phoma betae Negative    49. Candida Albicans Negative    50. Trichophyton mentagrophytes Negative    51. Mite, D Farinae  5,000 AU/ml Negative    52. Mite, D Pteronyssinus  5,000 AU/ml Negative    53. Cat Hair 10,000 BAU/ml 2+    54.  Dog Epithelia Negative  55. Mixed Feathers Negative    56. Horse Epithelia Negative    57. Cockroach, German Negative    58. Mouse Negative    59. Tobacco Leaf 2+          Intradermal - 11/18/20 1500    Time Antigen Placed 1500    Allergen Manufacturer Greer    Location Arm    Number of Test 7    Intradermal Select    Control Negative    Mold 2 2+    Mold 3 2+    Mold 4 --   +/-   Dog Negative    Cockroach Negative    Mite mix Negative          Food Adult Perc - 11/18/20 1400    Time Antigen Placed 1416    Allergen Manufacturer Waynette Buttery    Location Back    Number of allergen test 6    42. Tomato Negative    54. Cucumber Negative    56. Orange  Negative    60. Strawberry Negative    61. Cantaloupe --   +/-   62. Watermelon Negative           Past Medical History: Patient Active Problem List   Diagnosis Date Noted  . Other allergic rhinitis 11/19/2020  . Oral allergy syndrome, subsequent encounter 11/19/2020  . History of frequent upper respiratory infection 11/19/2020  . Encounter for screening for diseases of the blood and blood-forming organs and certain disorders involving the immune mechanism 11/19/2020  . Mild intermittent asthma without complication 11/19/2020  . Allergic conjunctivitis of both eyes 11/19/2020  . Dry eye 10/01/2020  . Aphthous stomatitis 10/01/2020  . Migraine with aura 06/02/2020  . Environmental allergies 06/02/2020  . IgA  deficiency (HCC) 06/02/2020  . Celiac disease 06/02/2020   Past Medical History:  Diagnosis Date  . Allergies   . Asthma   . Celiac disease   . Eczema   . IgA deficiency [facility])    Past Surgical History: Past Surgical History:  Procedure Laterality Date  . ADENOIDECTOMY    . TONSILLECTOMY AND ADENOIDECTOMY     Medication List:  Current Outpatient Medications  Medication Sig Dispense Refill  . azelastine (ASTELIN) 0.1 % nasal spray Place 1-2 sprays into both nostrils 2 (two) times daily as needed (runny nose, drainage). Use in each nostril as directed 30 mL 5  . cycloSPORINE (RESTASIS) 0.05 % ophthalmic emulsion 1 drop 2 (two) times daily.    Marland Kitchen etonogestrel (NEXPLANON) 68 MG IMPL implant 1 each by Subdermal route once.    . fluticasone (FLONASE) 50 MCG/ACT nasal spray Place 2 sprays into both nostrils daily. 16 g 0  . levocetirizine (XYZAL) 5 MG tablet Take 1 tablet (5 mg total) by mouth every evening. 30 tablet 5  . montelukast (SINGULAIR) 10 MG tablet Take 10 mg by mouth at bedtime.    . triamcinolone (KENALOG) 0.1 % paste Use as directed 1 application in the mouth or throat 2 (two) times daily. 5 g 12  . ofloxacin (FLOXIN) 0.3 % OTIC solution Place 5 drops into the right ear daily. (Patient not taking: Reported on 11/18/2020) 5 mL 0   No current facility-administered medications for this visit.   Allergies: Allergies  Allergen Reactions  . Sulfa Antibiotics    Social History: Social History   Socioeconomic History  . Marital status: Single    Spouse name: Not on file  . Number of children: Not on file  . Years  of education: Not on file  . Highest education level: Not on file  Occupational History  . Not on file  Tobacco Use  . Smoking status: Never Smoker  . Smokeless tobacco: Never Used  Vaping Use  . Vaping Use: Never used  Substance and Sexual Activity  . Alcohol use: Never  . Drug use: Never  . Sexual activity: Not on file  Other Topics Concern  . Not on  file  Social History Narrative  . Not on file   Social Determinants of Health   Financial Resource Strain: Not on file  Food Insecurity: Not on file  Transportation Needs: Not on file  Physical Activity: Not on file  Stress: Not on file  Social Connections: Not on file   Lives in an apartment. Smoking: denies Occupation: Associate Professor HistorySurveyor, minerals in the house: no Engineer, civil (consulting) in the family room: yes Carpet in the bedroom: yes Heating: electric Cooling: central Pet: no  Family History: Family History  Problem Relation Age of Onset  . Healthy Mother   . Allergic rhinitis Mother   . Celiac disease Father   . Allergic rhinitis Father    Review of Systems  Constitutional: Negative for appetite change, chills, fever and unexpected weight change.  HENT: Positive for congestion and sneezing. Negative for rhinorrhea.   Eyes: Positive for itching.  Respiratory: Negative for cough, chest tightness, shortness of breath and wheezing.   Cardiovascular: Negative for chest pain.  Gastrointestinal: Negative for abdominal pain.  Genitourinary: Negative for difficulty urinating.  Skin: Negative for rash.  Neurological: Negative for headaches.   Objective: BP 106/68   Pulse 82   Temp 98.3 F (36.8 C) (Temporal)   Resp 17   Ht 5' 7.25" (1.708 m)   Wt 143 lb 8 oz (65.1 kg)   SpO2 96%   BMI 22.31 kg/m  Body mass index is 22.31 kg/m. Physical Exam Vitals and nursing note reviewed.  Constitutional:      Appearance: Normal appearance. She is well-developed.  HENT:     Head: Normocephalic and atraumatic.     Right Ear: External ear normal.     Left Ear: External ear normal.     Nose: Nose normal.     Mouth/Throat:     Mouth: Mucous membranes are moist.     Pharynx: Oropharynx is clear.  Eyes:     Conjunctiva/sclera: Conjunctivae normal.  Cardiovascular:     Rate and Rhythm: Normal rate and regular rhythm.     Heart sounds: Normal heart sounds. No  murmur heard. No friction rub. No gallop.   Pulmonary:     Effort: Pulmonary effort is normal.     Breath sounds: Normal breath sounds. No wheezing, rhonchi or rales.  Abdominal:     Palpations: Abdomen is soft.  Musculoskeletal:     Cervical back: Neck supple.  Skin:    General: Skin is warm.     Findings: No rash.  Neurological:     Mental Status: She is alert and oriented to person, place, and time.  Psychiatric:        Behavior: Behavior normal.    The plan was reviewed with the patient/family, and all questions/concerned were addressed.  It was my pleasure to see Barbara Torres today and participate in her care. Please feel free to contact me with any questions or concerns.  Sincerely,  Wyline Mood, DO Allergy & Immunology  [facility] office: [phone] [facility] office:  [phone] 

## 2020-11-18 NOTE — Patient Instructions (Addendum)
Today's skin testing showed: Positive to grass, weed, ragweed, tree, mold, cat, tobacco. Negative to tomato, cucumber, orange, strawberry, watermelon.  Borderline to cantaloupe.  Environmental allergies  Start environmental control measures as below.  May use over the counter antihistamines such as Zyrtec (cetirizine), Claritin (loratadine), Allegra (fexofenadine), or Xyzal (levocetirizine) daily as needed. May take twice a day during flares.   Continue Singulair (montelukast) 10mg  daily at night.  May use Flonase (fluticasone) nasal spray 1 spray per nostril twice a day as needed for nasal congestion.   May use azelastine nasal spray 1-2 sprays per nostril twice a day as needed for runny nose/drainage.  Nasal saline spray (i.e., Simply Saline) or nasal saline lavage (i.e., NeilMed) is recommended as needed and prior to medicated nasal sprays.  Read about allergy injections - handout given. You would get 2 shots each visit.  Asthma: . Daily controller medication(s): none. . During upper respiratory infections/asthma flares: start Alvesco 1 puff twice a day and rinse mouth afterwards for 1-2 weeks until your breathing symptoms return to baseline. Sample given. . May use albuterol rescue inhaler 2 puffs every 4 to 6 hours as needed for shortness of breath, chest tightness, coughing, and wheezing. May use albuterol rescue inhaler 2 puffs 5 to 15 minutes prior to strenuous physical activities. Monitor frequency of use.  . Asthma control goals:  o Full participation in all desired activities (may need albuterol before activity) o Albuterol use two times or less a week on average (not counting use with activity) o Cough interfering with sleep two times or less a month o Oral steroids no more than once a year o No hospitalizations  Oral allergy syndrome  Avoid the foods that are bothersome.   Discussed that her food triggered oral and throat symptoms are likely caused by oral food  allergy syndrome (OFAS). This is caused by cross reactivity of pollen with fresh fruits and vegetables, and nuts. Symptoms are usually localized in the form of itching and burning in mouth and throat. Very rarely it can progress to more severe symptoms. Eating foods in cooked or processed forms usually minimizes symptoms. I recommended avoidance of eating the problem foods, especially during the peak season(s). Sometimes, OFAS can induce severe throat swelling or even a systemic reaction; with such instance, I advised them to report to a local ER. A list of common pollens and food cross-reactivities was provided to the patient.   Low IgA  Keep track of infections Get bloodwork:  We are ordering labs, so please allow 1-2 weeks for the results to come back. With the newly implemented Cures Act, the labs might be visible to you at the same time that they become visible to me. However, I will not address the results until all of the results are back, so please be patient.  In the meantime, continue recommendations in your patient instructions, including avoidance measures (if applicable), until you hear from me.  Follow up in 2 months or sooner if needed.   Reducing Pollen Exposure . Pollen seasons: trees (spring), grass (summer) and ragweed/weeds (fall). [birth year]1980 Keep windows closed in your home and car to lower pollen exposure.  Marland Kitchen air conditioning in the bedroom and throughout the house if possible.  . Avoid going out in dry windy days - especially early morning. . Pollen counts are highest between 5 - 10 AM and on dry, hot and windy days.  . Save outside activities for late afternoon or after a heavy rain, when  pollen levels are lower.  . Avoid mowing of grass if you have grass pollen allergy. Marland Kitchen Be aware that pollen can also be transported indoors on people and pets.  . Dry your clothes in an automatic dryer rather than hanging them outside where they might collect pollen.  . Rinse hair and  eyes before bedtime. Mold Control . Mold and fungi can grow on a variety of surfaces provided certain temperature and moisture conditions exist.  . Outdoor molds grow on plants, decaying vegetation and soil. The major outdoor mold, Alternaria and Cladosporium, are found in very high numbers during hot and dry conditions. Generally, a late summer - fall peak is seen for common outdoor fungal spores. Rain will temporarily lower outdoor mold spore count, but counts rise rapidly when the rainy period ends. . The most important indoor molds are Aspergillus and Penicillium. Dark, humid and poorly ventilated basements are ideal sites for mold growth. The next most common sites of mold growth are the bathroom and the kitchen. Outdoor (Seasonal) Mold Control . Use air conditioning and keep windows closed. . Avoid exposure to decaying vegetation. Marland Kitchen Avoid leaf raking. . Avoid grain handling. . Consider wearing a face mask if working in moldy areas.  Indoor (Perennial) Mold Control  . Maintain humidity below 50%. . Get rid of mold growth on hard surfaces with water, detergent and, if necessary, 5% bleach (do not mix with other cleaners). Then dry the area completely. If mold covers an area more than 10 square feet, consider hiring an indoor environmental professional. . For clothing, washing with soap and water is best. If moldy items cannot be cleaned and dried, throw them away. . Remove sources e.g. contaminated carpets. . Repair and seal leaking roofs or pipes. Using dehumidifiers in damp basements may be helpful, but empty the water and clean units regularly to prevent mildew from forming. All rooms, especially basements, bathrooms and kitchens, require ventilation and cleaning to deter mold and mildew growth. Avoid carpeting on concrete or damp floors, and storing items in damp areas. Pet Allergen Avoidance: . Contrary to popular opinion, there are no "hypoallergenic" breeds of dogs or cats. That is  because people are not allergic to an animal's hair, but to an allergen found in the animal's saliva, dander (dead skin flakes) or urine. Pet allergy symptoms typically occur within minutes. For some people, symptoms can build up and become most severe 8 to 12 hours after contact with the animal. People with severe allergies can experience reactions in public places if dander has been transported on the pet owners' clothing. Marland Kitchen Keeping an animal outdoors is only a partial solution, since homes with pets in the yard still have higher concentrations of animal allergens. . Before getting a pet, ask your allergist to determine if you are allergic to animals. If your pet is already considered part of your family, try to minimize contact and keep the pet out of the bedroom and other rooms where you spend a great deal of time. . As with dust mites, vacuum carpets often or replace carpet with a hardwood floor, tile or linoleum. . High-efficiency particulate air (HEPA) cleaners can reduce allergen levels over time. . While dander and saliva are the source of cat and dog allergens, urine is the source of allergens from rabbits, hamsters, mice and [place] pigs; so ask a non-allergic family member to clean the animal's cage. . If you have a pet allergy, talk to your allergist about the potential for allergy immunotherapy (  allergy shots). This strategy can often provide long-term relief.

## 2020-11-19 ENCOUNTER — Encounter: Payer: Self-pay | Admitting: Allergy

## 2020-11-19 DIAGNOSIS — Z8709 Personal history of other diseases of the respiratory system: Secondary | ICD-10-CM | POA: Insufficient documentation

## 2020-11-19 DIAGNOSIS — T7819XD Other adverse food reactions, not elsewhere classified, subsequent encounter: Secondary | ICD-10-CM | POA: Insufficient documentation

## 2020-11-19 DIAGNOSIS — T781XXD Other adverse food reactions, not elsewhere classified, subsequent encounter: Secondary | ICD-10-CM | POA: Insufficient documentation

## 2020-11-19 DIAGNOSIS — Z130 Encounter for screening for diseases of the blood and blood-forming organs and certain disorders involving the immune mechanism: Secondary | ICD-10-CM | POA: Insufficient documentation

## 2020-11-19 DIAGNOSIS — J3089 Other allergic rhinitis: Secondary | ICD-10-CM | POA: Insufficient documentation

## 2020-11-19 DIAGNOSIS — J4520 Mild intermittent asthma, uncomplicated: Secondary | ICD-10-CM | POA: Insufficient documentation

## 2020-11-19 DIAGNOSIS — H1013 Acute atopic conjunctivitis, bilateral: Secondary | ICD-10-CM | POA: Insufficient documentation

## 2020-11-19 NOTE — Assessment & Plan Note (Signed)
Noted mild facial hives after eating melons, oranges, strawberries, cucumber, tomato sometimes.  Used to have perioral hives after peanut ingestion but now tolerates with no issues.  Diagnosed with celiac's in 2006.  Today's skin testing showed: Negative to tomato, cucumber, orange, strawberry, watermelon.  Borderline to cantaloupe.  Avoid the foods that are bothersome.   Discussed that her food triggered oral and throat symptoms are likely caused by oral food allergy syndrome (OFAS). This is caused by cross reactivity of pollen with fresh fruits and vegetables, and nuts. Symptoms are usually localized in the form of itching and burning in mouth and throat. Very rarely it can progress to more severe symptoms. Eating foods in cooked or processed forms usually minimizes symptoms. I recommended avoidance of eating the problem foods, especially during the peak season(s). Sometimes, OFAS can induce severe throat swelling or even a systemic reaction; with such instance, I advised them to report to a local ER. A list of common pollens and food cross-reactivities was provided to the patient.

## 2020-11-19 NOTE — Assessment & Plan Note (Signed)
   See assessment and plan as above for allergic rhinitis.  

## 2020-11-19 NOTE — Assessment & Plan Note (Addendum)
Rhinoconjunctivitis symptoms for the past 15 years with worsening from spring through fall.  Skin testing in the past was positive to trees and grass per patient report.  Was on allergy immunotherapy briefly at age 20 with unknown benefit.  Today's skin testing showed: Positive to grass, weed, ragweed, tree, mold, cat, tobacco.  Start environmental control measures as below.  May use over the counter antihistamines such as Zyrtec (cetirizine), Claritin (loratadine), Allegra (fexofenadine), or Xyzal (levocetirizine) daily as needed. May take twice a day during flares.   Continue Singulair (montelukast) 10mg  daily at night.  May use Flonase (fluticasone) nasal spray 1 spray per nostril twice a day as needed for nasal congestion.   May use azelastine nasal spray 1-2 sprays per nostril twice a day as needed for runny nose/drainage.  Nasal saline spray (i.e., Simply Saline) or nasal saline lavage (i.e., NeilMed) is recommended as needed and prior to medicated nasal sprays.  Read about allergy injections - handout given.

## 2020-11-19 NOTE — Assessment & Plan Note (Signed)
Diagnosed with asthma over 20 years ago and currently on Singulair and albuterol as needed with good benefit.  Used to be on Advair 5+ years ago.  Main triggers are allergies, infections and winter months.  1 prednisone course in November 2021.  Today's spirometry was normal. . Daily controller medication(s): none. . During upper respiratory infections/asthma flares: start Alvesco 1 puff twice a day and rinse mouth afterwards for 1-2 weeks until your breathing symptoms return to baseline. Sample given. . May use albuterol rescue inhaler 2 puffs every 4 to 6 hours as needed for shortness of breath, chest tightness, coughing, and wheezing. May use albuterol rescue inhaler 2 puffs 5 to 15 minutes prior to strenuous physical activities. Monitor frequency of use.

## 2020-11-19 NOTE — Assessment & Plan Note (Signed)
History of frequent infections including sinusitis, pneumonia and ear infections.  Diagnosed with IgA deficiency at age 20 and last time IgA level was 10.  She is not sure about her other immunoglobulin levels.  Usually requires 2-3 courses of antibiotics in 1 year.  Keep track of infections Get bloodwork as below.

## 2020-11-29 DIAGNOSIS — H16223 Keratoconjunctivitis sicca, not specified as Sjogren's, bilateral: Secondary | ICD-10-CM | POA: Diagnosis not present

## 2020-12-09 LAB — CBC WITH DIFFERENTIAL/PLATELET
Basophils Absolute: 0 10*3/uL (ref 0.0–0.2)
Basos: 1 %
EOS (ABSOLUTE): 0.2 10*3/uL (ref 0.0–0.4)
Eos: 3 %
Hematocrit: 44.7 % (ref 34.0–46.6)
Hemoglobin: 15 g/dL (ref 11.1–15.9)
Immature Grans (Abs): 0 10*3/uL (ref 0.0–0.1)
Immature Granulocytes: 0 %
Lymphocytes Absolute: 1.4 10*3/uL (ref 0.7–3.1)
Lymphs: 31 %
MCH: 29.9 pg (ref 26.6–33.0)
MCHC: 33.6 g/dL (ref 31.5–35.7)
MCV: 89 fL (ref 79–97)
Monocytes Absolute: 0.3 10*3/uL (ref 0.1–0.9)
Monocytes: 6 %
Neutrophils Absolute: 2.6 10*3/uL (ref 1.4–7.0)
Neutrophils: 59 %
Platelets: 284 10*3/uL (ref 150–450)
RBC: 5.02 x10E6/uL (ref 3.77–5.28)
RDW: 12 % (ref 11.7–15.4)
WBC: 4.5 10*3/uL (ref 3.4–10.8)

## 2020-12-09 LAB — DIPHTHERIA / TETANUS ANTIBODY PANEL
Diphtheria Ab: 1.96 IU/mL (ref <0.10)
Tetanus Ab, IgG: 2.39 IU/mL (ref <0.10)

## 2020-12-09 LAB — STREP PNEUMONIAE 23 SEROTYPES IGG
Pneumo Ab Type 1*: 1 ug/mL — ABNORMAL LOW (ref >1.3)
Pneumo Ab Type 12 (12F)*: 0.2 ug/mL — ABNORMAL LOW (ref >1.3)
Pneumo Ab Type 14*: 6.4 ug/mL (ref >1.3)
Pneumo Ab Type 17 (17F)*: 3.5 ug/mL (ref >1.3)
Pneumo Ab Type 19 (19F)*: 3.5 ug/mL (ref >1.3)
Pneumo Ab Type 2*: 1.2 ug/mL — ABNORMAL LOW (ref >1.3)
Pneumo Ab Type 20*: 2.7 ug/mL (ref >1.3)
Pneumo Ab Type 22 (22F)*: 1.4 ug/mL (ref >1.3)
Pneumo Ab Type 23 (23F)*: 1.4 ug/mL (ref >1.3)
Pneumo Ab Type 26 (6B)*: 3.8 ug/mL (ref >1.3)
Pneumo Ab Type 3*: 4.7 ug/mL (ref >1.3)
Pneumo Ab Type 34 (10A)*: 0.7 ug/mL — ABNORMAL LOW (ref >1.3)
Pneumo Ab Type 4*: 0.6 ug/mL — ABNORMAL LOW (ref >1.3)
Pneumo Ab Type 43 (11A)*: 0.9 ug/mL — ABNORMAL LOW (ref >1.3)
Pneumo Ab Type 5*: 0.8 ug/mL — ABNORMAL LOW (ref >1.3)
Pneumo Ab Type 51 (7F)*: 0.3 ug/mL — ABNORMAL LOW (ref >1.3)
Pneumo Ab Type 54 (15B)*: 0.6 ug/mL — ABNORMAL LOW (ref >1.3)
Pneumo Ab Type 56 (18C)*: 1.6 ug/mL (ref >1.3)
Pneumo Ab Type 57 (19A)*: 4.3 ug/mL (ref >1.3)
Pneumo Ab Type 68 (9V)*: 2 ug/mL (ref >1.3)
Pneumo Ab Type 70 (33F)*: 0.5 ug/mL — ABNORMAL LOW (ref >1.3)
Pneumo Ab Type 8*: 2.1 ug/mL (ref >1.3)
Pneumo Ab Type 9 (9N)*: 0.7 ug/mL — ABNORMAL LOW (ref >1.3)

## 2020-12-09 LAB — IGG, IGA, IGM
IgA/Immunoglobulin A, Serum: <5 mg/dL — ABNORMAL LOW (ref 87–352)
IgG (Immunoglobin G), Serum: 1382 mg/dL (ref 586–1602)
IgM (Immunoglobulin M), Srm: 168 mg/dL (ref 26–217)

## 2020-12-21 DIAGNOSIS — H16223 Keratoconjunctivitis sicca, not specified as Sjogren's, bilateral: Secondary | ICD-10-CM | POA: Diagnosis not present

## 2021-01-14 ENCOUNTER — Other Ambulatory Visit: Payer: Self-pay

## 2021-01-14 ENCOUNTER — Ambulatory Visit (INDEPENDENT_AMBULATORY_CARE_PROVIDER_SITE_OTHER): Payer: BC Managed Care – PPO | Admitting: Family Medicine

## 2021-01-14 ENCOUNTER — Encounter: Payer: Self-pay | Admitting: Family Medicine

## 2021-01-14 VITALS — BP 110/58 | HR 100 | Ht 67.0 in | Wt 144.8 lb

## 2021-01-14 DIAGNOSIS — G43109 Migraine with aura, not intractable, without status migrainosus: Secondary | ICD-10-CM | POA: Diagnosis not present

## 2021-01-14 DIAGNOSIS — Z23 Encounter for immunization: Secondary | ICD-10-CM | POA: Diagnosis not present

## 2021-01-14 MED ORDER — PROPRANOLOL HCL 10 MG PO TABS: 10.0000 mg | ORAL_TABLET | Freq: Two times a day (BID) | ORAL | 2 refills | Status: DC

## 2021-01-14 NOTE — Assessment & Plan Note (Addendum)
Occur exclusively with menstrual cycle. Not well controlled currently. Patient would prefer an abortive medication but was unable to tolerate triptans in the past. Excedrin and Ibuprofen have been ineffective. -Resume Propranolol 10mg  BID for prophylaxis -Can consider referral to headache clinic in the future but patient declines at this time. Discussed with patient that they are likely more familiar with alternate abortive treatment options such as CGRP antagonists.

## 2021-01-14 NOTE — Patient Instructions (Addendum)
It was great to see you!  Things we discussed at today's visit: - We will start a daily medication (Propanolol) to help prevent your migraines.  - If you decide you'd like to see the headache specialist, let us know.   Take care and seek immediate care sooner if you develop any concerns.  Dr. [facility]

## 2021-01-14 NOTE — Progress Notes (Signed)
    SUBJECTIVE:   CHIEF COMPLAINT / HPI:   Migraine w/Aura Patient has a long history of migraines with aura that exclusively occur prior to her menstrual cycle. She had a Nexplanon placed 9 months ago and went ~5 months without a period, therefore went ~5 months without a migraine. However, with her last 3 periods her migraines have returned and are debilitating. They occur daily in the 3-4 days leading up to her period. She takes Excedrin and Ibuprofen which help with the headache pain but not with the aura.  Patient would like to resume some type of medication for her symptoms. She tried various Triptans in the past but was unable to tolerate them due to adverse effects (neck pain in particular). She had been on Propranolol 10mg  BID in the past with good effect. This was stopped because patient was amenorrheic and not getting headaches, so she preferred not to take a daily medication.  Requesting Pneumonia Vaccine Patient reports her allergist recommended PCV immunization due to her IgA deficiency and she had labs done showing low antibody titers against strep pneumo.   PERTINENT  PMH / PSH: Migraines w/aura, IgA deficiency, celiac disease, mild intermittent asthma  OBJECTIVE:   BP (!) 110/58   Pulse 100   Ht 5\' 7"  (1.702 m)   Wt 144 lb 12.8 oz (65.7 kg)   LMP 01/10/2021 (Approximate)   SpO2 99%   BMI 22.68 kg/m   General: NAD, pleasant, able to participate in exam Respiratory: normal effort Extremities: no edema or cyanosis. Skin: warm and dry, no rashes noted Neuro: alert, no obvious focal deficits, normal gait Psych: Normal affect and mood   ASSESSMENT/PLAN:   Migraine with aura Occur exclusively with menstrual cycle. Not well controlled currently. Patient would prefer an abortive medication but was unable to tolerate triptans in the past. Excedrin and Ibuprofen have been ineffective. -Resume Propranolol 10mg  BID for prophylaxis -Can consider referral to headache clinic in  the future but patient declines at this time. Discussed with patient that they are likely more familiar with alternate abortive treatment options such as CGRP antagonists.   Encounter for Immunization -PCV20 given today   , MD [facility]

## 2021-01-20 ENCOUNTER — Telehealth: Payer: Self-pay

## 2021-01-20 DIAGNOSIS — Z8709 Personal history of other diseases of the respiratory system: Secondary | ICD-10-CM

## 2021-01-20 NOTE — Telephone Encounter (Signed)
Lab order has been placed for repeat titers. Called patient and advised that she can go to the [facility] around 02/11/2021 and have her labs drawn. Patient verbalized understanding.

## 2021-01-20 NOTE — Telephone Encounter (Signed)
Patient called stating she received her pneumovax vaccine on 01/14/2021 with her PCP. Patient is wondering when she needs to come back for the lab follow up? Patient wants to use [facility] on [address] to do her lab draw.  Please advise

## 2021-01-21 ENCOUNTER — Ambulatory Visit: Payer: BC Managed Care – PPO | Admitting: Allergy

## 2021-01-21 ENCOUNTER — Ambulatory Visit: Payer: BC Managed Care – PPO

## 2021-01-26 ENCOUNTER — Other Ambulatory Visit: Payer: Self-pay | Admitting: Allergy

## 2021-01-26 ENCOUNTER — Telehealth: Payer: Self-pay | Admitting: Allergy

## 2021-01-26 ENCOUNTER — Other Ambulatory Visit: Payer: Self-pay

## 2021-01-26 MED ORDER — MONTELUKAST SODIUM 10 MG PO TABS: 10.0000 mg | ORAL_TABLET | Freq: Every day | ORAL | 0 refills | Status: DC

## 2021-01-26 NOTE — Telephone Encounter (Signed)
Patient has changed her pharmacy, due to insurance, and is requesting a prescription for Singulair be sent to [facility] on [address]. Her old pharmacy was [facility] on [address]. She called them to transfer it, they told her to call us to send a new prescription.

## 2021-01-26 NOTE — Telephone Encounter (Signed)
Sent in 1 refill with no additional refills to [facility] [address] as pt is due for an office visit

## 2021-02-11 ENCOUNTER — Telehealth: Payer: Self-pay | Admitting: Allergy

## 2021-02-11 NOTE — Telephone Encounter (Signed)
Patient states she received her pneumonia vaccine about 4 weeks ago and was sent in a lab order. Patient states she went to [facility] today and due to cost, she was not able to get it. She is wanting to know if the lab test is really needed since she can't afford it right now.

## 2021-02-11 NOTE — Telephone Encounter (Signed)
Please advise to getting the pneumo titer redrawn

## 2021-02-12 NOTE — Telephone Encounter (Signed)
We can wait to get it drawn but there is no other way for [place] to check if she responded appropriately or not.

## 2021-02-14 NOTE — Telephone Encounter (Signed)
Called and left a voicemail asking for patient to return call to inform.  

## 2021-02-14 NOTE — Telephone Encounter (Signed)
Patient called back and was advised. Patient verbalized understanding.  

## 2021-04-28 ENCOUNTER — Other Ambulatory Visit: Payer: Self-pay

## 2021-04-28 DIAGNOSIS — G43109 Migraine with aura, not intractable, without status migrainosus: Secondary | ICD-10-CM

## 2021-04-29 MED ORDER — PROPRANOLOL HCL 10 MG PO TABS: 10.0000 mg | ORAL_TABLET | Freq: Two times a day (BID) | ORAL | 2 refills | Status: DC

## 2021-05-06 ENCOUNTER — Other Ambulatory Visit: Payer: Self-pay

## 2021-05-06 DIAGNOSIS — G43109 Migraine with aura, not intractable, without status migrainosus: Secondary | ICD-10-CM

## 2021-05-07 MED ORDER — PROPRANOLOL HCL 10 MG PO TABS: 10.0000 mg | ORAL_TABLET | Freq: Two times a day (BID) | ORAL | 2 refills | Status: DC

## 2021-05-09 ENCOUNTER — Other Ambulatory Visit: Payer: Self-pay

## 2021-05-09 ENCOUNTER — Ambulatory Visit: Payer: 59 | Admitting: Family Medicine

## 2021-05-09 VITALS — BP 117/73 | HR 76 | Wt 142.5 lb

## 2021-05-09 DIAGNOSIS — R42 Dizziness and giddiness: Secondary | ICD-10-CM

## 2021-05-09 DIAGNOSIS — Z23 Encounter for immunization: Secondary | ICD-10-CM

## 2021-05-09 DIAGNOSIS — N920 Excessive and frequent menstruation with regular cycle: Secondary | ICD-10-CM

## 2021-05-09 LAB — POCT URINE PREGNANCY: Preg Test, Ur: NEGATIVE

## 2021-05-09 MED ORDER — NORETHINDRONE 0.35 MG PO TABS: 1.0000 | ORAL_TABLET | Freq: Every day | ORAL | 0 refills | Status: DC

## 2021-05-09 NOTE — Assessment & Plan Note (Addendum)
Was previously not having periods while starting the Nexplanon but then started having them about twice per month.  Normally has heavy periods and goes through a pad about every 2-3 hours, the periods since Nexplanon have been lighter but happening about every 14 days over the past few months.  We will check for anemia with a CBC.  U pregnancy test today negative.  Discussed using Micronor for 14 days to help reset the uterine lining.  Patient has a history of migraine with aura and so OCPs are contraindicated.  Recommended using progesterone only oral birth control in addition to her Nexplanon and reassess in her menses after discontinuing this in about 14 days.  We will check a CBC and a TSH.  If the 14 days of Micronor does not help I would recommend referring back to gynecology for additional recommendations.  She may benefit from other contraceptives such as IUD.

## 2021-05-09 NOTE — Progress Notes (Signed)
    SUBJECTIVE:   CHIEF COMPLAINT / HPI:   Frequent periods, lightheadedness: 20 year old female presenting with the above.  Per documentation it appears she had a Nexplanon placed in April 2021.  She has a well-documented history of migraine with aura which would contraindicate OCPs.  Recent hemoglobin of 15.0 on 11/22/2020.  She states she has been having 2 periods a month since April. She previously had a few months of no periods when initiating nexplanon. She said her periods have been lighter but her normal periods are really heavy. She normally changes pads every 2-3 hours with normal periods and these have been lighter.   Nausea/fainting/lightheadedness: Patient states that over the past weeks when she has been having frequent.  She has had bouts of nausea, fainting, lightheadedness. She states she normally feels this way on her normal periods as well as her migraines. She states that when she was not having these symptoms when not having periods. She has had 2 spells of fainting at home which she could feel coming on and so laid down and had no issues. States they were only for a few seonds.   PERTINENT  PMH / PSH: None relevant   OBJECTIVE:   BP 117/73   Pulse 76   Wt 142 lb 8 oz (64.6 kg)   LMP 05/04/2021   SpO2 98%   BMI 22.32 kg/m   Orthostatic vitals within expected limits  General: NAD, pleasant, able to participate in exam Respiratory: No respiratory distress Skin: warm and dry, no rashes noted Psych: Normal affect and mood ASSESSMENT/PLAN:   Frequent menstruation Was previously not having periods while starting the Nexplanon but then started having them about twice per month.  Normally has heavy periods and goes through a pad about every 2-3 hours, the periods since Nexplanon have been lighter but happening about every 14 days over the past few months.  We will check for anemia with a CBC.  U pregnancy test today negative.  Discussed using Micronor for 14 days to help  reset the uterine lining.  Patient has a history of migraine with aura and so OCPs are contraindicated.  Recommended using progesterone only oral birth control in addition to her Nexplanon and reassess in her menses after discontinuing this in about 14 days.  We will check a CBC and a TSH.  If the 14 days of Micronor does not help I would recommend referring back to gynecology for additional recommendations.  She may benefit from other contraceptives such as IUD.   Nausea/fainting/lightheadedness: These only seem to occur in the midst of her menses.  She has a history of these symptoms during previous periods.  When her menses stopped during the initiation of Nexplanon the symptoms went away.  Patient with no symptoms concerning for seizures, she can tell that the lightheadedness and fainting spells are about to occur and is able to lie down when they are about to start.  Orthostatics normal today.  We will check a CBC and TSH as above.  We will do Micronor as above to see if we can reset the patient's menses to see if this will assist with her symptoms.  Jackelyn Poling, DO [facility]

## 2021-05-09 NOTE — Patient Instructions (Addendum)
Today we checked a urine pregnancy test which was negative  For your frequent periods I think it would be worth trialing a oral birth control pill which only contains progesterone to see if we can reset the uterine lining.  I would like for you to take this for 14 days and then stop.  Oftentimes you will have a normal period when we discontinue it and then your cycles will become more regular, however if this does not happen we may need to refer you back to gynecology.  We are also going to check some lab work including a CBC to look for anemia as well as a TSH as sometimes thyroid conditions can cause frequent menses.

## 2021-05-10 LAB — CBC
Hematocrit: 42.4 % (ref 34.0–46.6)
Hemoglobin: 14 g/dL (ref 11.1–15.9)
MCH: 29.1 pg (ref 26.6–33.0)
MCHC: 33 g/dL (ref 31.5–35.7)
MCV: 88 fL (ref 79–97)
Platelets: 289 10*3/uL (ref 150–450)
RBC: 4.81 x10E6/uL (ref 3.77–5.28)
RDW: 12.3 % (ref 11.7–15.4)
WBC: 6.3 10*3/uL (ref 3.4–10.8)

## 2021-05-10 LAB — TSH: TSH: 0.938 u[IU]/mL (ref 0.450–4.500)

## 2021-06-10 ENCOUNTER — Encounter: Payer: BC Managed Care – PPO | Admitting: Obstetrics & Gynecology

## 2021-06-13 ENCOUNTER — Encounter: Payer: BC Managed Care – PPO | Admitting: Family Medicine

## 2021-06-22 ENCOUNTER — Ambulatory Visit: Payer: 59 | Admitting: Neurology

## 2021-06-22 ENCOUNTER — Encounter: Payer: Self-pay | Admitting: Neurology

## 2021-06-22 VITALS — BP 115/74 | HR 76 | Ht 68.0 in | Wt 144.0 lb

## 2021-06-22 DIAGNOSIS — G43109 Migraine with aura, not intractable, without status migrainosus: Secondary | ICD-10-CM

## 2021-06-22 MED ORDER — TOPIRAMATE 50 MG PO TABS: 100.0000 mg | ORAL_TABLET | Freq: Every day | ORAL | 11 refills | Status: AC

## 2021-06-22 MED ORDER — UBRELVY 50 MG PO TABS: ORAL_TABLET | ORAL | 11 refills | Status: AC

## 2021-06-22 NOTE — Progress Notes (Signed)
Chief Complaint  Patient presents with   New Patient (Initial Visit)    Rm 16 alone -  pt is here for worsening migraines. Pt reports they are worst around her cycles. Pt is on propranolol, has not noticed much benefit. Has also tried triptans sts her neck and throat felt tight when she tried these.      ASSESSMENT AND PLAN  Barbara Torres is a 20 y.o. female   Chronic migraine with aura  Common trigger her menstruation cycle improved with better regulation  Will try Topamax 50 mg 2 tablets every night as preventive medication  Previously tried and failed multiple triptans including Imitrex, Maxalt, Zomig,  Will try Ubrelvy as needed  DIAGNOSTIC DATA (LABS, IMAGING, TESTING) - I reviewed patient records, labs, notes, testing and imaging myself where available.   MEDICAL HISTORY:  Barbara Torres is a 20 year old female, seen in request by Dr.   Conni Elliot, Elonda Husky A, for worsening headaches, her primary care physician is Dr.Wells, Apolonio Schneiders, initial evaluation was on June 22, 2021  I reviewed and summarized the referring note. PMX. Allergy  She reported history of migraine headaches since 20 years old, typical migraine a right retro-orbital area sharp severe headache, light noise sensitivity, nauseous, lasting for couple hours or longer, often preceded by visual aura, seeing stars in her visual field, sometimes vertigo  She has tried over-the-counter Excedrin Migraine which seems to help her some, but incomplete resolution of her headache  Previously tried Maxalt, Imitrex, Zomig, did not have significant improvement, make her have significant neck pain throat tightness afterwards  Treatment for headache or dehydration, missing a meal, menstruation cycle  She was started on propanolol since May, but this is concurrent with her irregular menstruation cycle, continue have significant headaches around her menstruation cycle, also developed fainting presyncope spells with excessive  bleeding, now with better regulation of her menstruation cycle, migraine frequency has decreased, 1 twice each month around her menstruation cycle  PHYSICAL EXAM:   There were no vitals filed for this visit. Not recorded     There is no height or weight on file to calculate BMI.  PHYSICAL EXAMNIATION:  Gen: NAD, conversant, well nourised, well groomed                     Cardiovascular: Regular rate rhythm, no peripheral edema, warm, nontender. Eyes: Conjunctivae clear without exudates or hemorrhage Neck: Supple, no carotid bruits. Pulmonary: Clear to auscultation bilaterally   NEUROLOGICAL EXAM:  MENTAL STATUS: Speech:    Speech is normal; fluent and spontaneous with normal comprehension.  Cognition:     Orientation to time, place and person     Normal recent and remote memory     Normal Attention span and concentration     Normal Language, naming, repeating,spontaneous speech     Fund of knowledge   CRANIAL NERVES: CN II: Visual fields are full to confrontation. Pupils are round equal and briskly reactive to light. CN III, IV, VI: extraocular movement are normal. No ptosis. CN V: Facial sensation is intact to light touch CN VII: Face is symmetric with normal eye closure  CN VIII: Hearing is normal to causal conversation. CN IX, X: Phonation is normal. CN XI: Head turning and shoulder shrug are intact  MOTOR: There is no pronator drift of out-stretched arms. Muscle bulk and tone are normal. Muscle strength is normal.  REFLEXES: Reflexes are 2+ and symmetric at the biceps, triceps, knees, and ankles. Plantar responses are flexor.  SENSORY: Intact to light touch, pinprick and vibratory sensation are intact in fingers and toes.  COORDINATION: There is no trunk or limb dysmetria noted.  GAIT/STANCE: Posture is normal. Gait is steady with normal steps, base, arm swing, and turning. Heel and toe walking are normal. Tandem gait is normal.  Romberg is absent.  REVIEW  OF SYSTEMS:  Full 14 system review of systems performed and notable only for as above All other review of systems were negative.   ALLERGIES: Allergies  Allergen Reactions   Sulfa Antibiotics     HOME MEDICATIONS: Current Outpatient Medications  Medication Sig Dispense Refill   azelastine (ASTELIN) 0.1 % nasal spray Place 1-2 sprays into both nostrils 2 (two) times daily as needed (runny nose, drainage). Use in each nostril as directed 30 mL 5   etonogestrel (NEXPLANON) 68 MG IMPL implant 1 each by Subdermal route once.     fluticasone (FLONASE) 50 MCG/ACT nasal spray Place 2 sprays into both nostrils daily. 16 g 0   montelukast (SINGULAIR) 10 MG tablet TAKE 1 TABLET(10 MG) BY MOUTH AT BEDTIME 90 tablet 1   propranolol (INDERAL) 10 MG tablet Take 1 tablet (10 mg total) by mouth 2 (two) times daily. 60 tablet 2   triamcinolone (KENALOG) 0.1 % paste Use as directed 1 application in the mouth or throat 2 (two) times daily. 5 g 12   No current facility-administered medications for this visit.    PAST MEDICAL HISTORY: Past Medical History:  Diagnosis Date   Allergies    Asthma    Celiac disease    Eczema    IgA deficiency (HCC)     PAST SURGICAL HISTORY: Past Surgical History:  Procedure Laterality Date   ADENOIDECTOMY     TONSILLECTOMY AND ADENOIDECTOMY      FAMILY HISTORY: Family History  Problem Relation Age of Onset   Healthy Mother    Allergic rhinitis Mother    Celiac disease Father    Allergic rhinitis Father     SOCIAL HISTORY: Social History   Socioeconomic History   Marital status: Single    Spouse name: Not on file   Number of children: Not on file   Years of education: Not on file   Highest education level: Not on file  Occupational History   Not on file  Tobacco Use   Smoking status: Never   Smokeless tobacco: Never  Vaping Use   Vaping Use: Never used  Substance and Sexual Activity   Alcohol use: Never   Drug use: Never   Sexual activity:  Not on file  Other Topics Concern   Not on file  Social History Narrative   Not on file   Social Determinants of Health   Financial Resource Strain: Not on file  Food Insecurity: Not on file  Transportation Needs: Not on file  Physical Activity: Not on file  Stress: Not on file  Social Connections: Not on file  Intimate Partner Violence: Not on file      Levert Feinstein, M.D. Ph.D.  [facility] [address] [place], [place] [address] Ph: [phone] Fax: [phone]  CC:  Toy Baker, DO [address] [place],  [place] [address]  Maury Dus, MD

## 2021-07-10 ENCOUNTER — Other Ambulatory Visit: Payer: Self-pay

## 2021-07-10 ENCOUNTER — Encounter (HOSPITAL_COMMUNITY): Payer: Self-pay | Admitting: Emergency Medicine

## 2021-07-10 ENCOUNTER — Ambulatory Visit (HOSPITAL_COMMUNITY)
Admission: EM | Admit: 2021-07-10 | Discharge: 2021-07-10 | Disposition: A | Discharge status: Home / Self Care | Payer: 59

## 2021-07-10 DIAGNOSIS — J069 Acute upper respiratory infection, unspecified: Secondary | ICD-10-CM

## 2021-07-10 LAB — POC INFLUENZA A AND B ANTIGEN (URGENT CARE ONLY)
INFLUENZA A ANTIGEN, POC: NEGATIVE
INFLUENZA B ANTIGEN, POC: NEGATIVE

## 2021-07-10 MED ORDER — PROMETHAZINE-DM 6.25-15 MG/5ML PO SYRP: 5.0000 mL | ORAL_SOLUTION | Freq: Four times a day (QID) | ORAL | 0 refills | Status: DC | PRN

## 2021-07-10 MED ORDER — PREDNISONE 20 MG PO TABS: 20.0000 mg | ORAL_TABLET | Freq: Every day | ORAL | 0 refills | Status: AC

## 2021-07-10 NOTE — ED Notes (Signed)
Nasal swab in lab 

## 2021-07-10 NOTE — ED Provider Notes (Signed)
[facility]    CSN: [id number] Arrival date & time: 07/10/21  1155      History   Chief Complaint Chief Complaint  Patient presents with   Cough    HPI Barbara Torres is a 20 y.o. female.   Subjective:   Barbara Torres is a 20 y.o. female who presents for evaluation of symptoms of a URI. Symptoms include fevers up to 101.3 degrees, headache, myalgias, runny nose, nasal congestion, productive cough, nausea, diarrhea and sore throat. She also had vomiting once yesterday but none since. Patient reports shortness of breath and chest tightness with the cough. Onset of symptoms was 3 days ago, gradually worsening since that time. She denies any chills, wheezing or dizziness. She is drinking plenty of fluids but doesn't have much of an appetite. Treatment to date: dayquil, nyquil, ibuprofen and her inhaler. She denies any known sick contacts or FLU/COVID exposure. She has a history of COVID x2 but has not had it within the past 90 days. She is completely vaccinated against COVID with boosters x2. She's also received the flu vaccine this season. She took two home COVID tests PTA which were negative.   The following portions of the patient's history were reviewed and updated as appropriate: allergies, current medications, past family history, past medical history, past social history, past surgical history, and problem list.    Past Medical History:  Diagnosis Date   Allergies    Asthma    Celiac disease    Eczema    IgA deficiency (HCC)     Patient Active Problem List   Diagnosis Date Noted   Chronic migraine with aura 06/22/2021   Frequent menstruation 05/09/2021   Other allergic rhinitis 11/19/2020   Oral allergy syndrome, subsequent encounter 11/19/2020   History of frequent upper respiratory infection 11/19/2020   Mild intermittent asthma without complication 11/19/2020   Allergic conjunctivitis of both eyes 11/19/2020   Dry eye 10/01/2020   Aphthous  stomatitis 10/01/2020   Migraine with aura 06/02/2020   Environmental allergies 06/02/2020   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      Home Medications    Prior to Admission medications   Medication Sig Start Date End Date Taking? Authorizing Provider  ALBUTEROL IN Inhale into the lungs.   Yes [provider]  azelastine (ASTELIN) 0.1 % nasal spray Place 1-2 sprays into both nostrils 2 (two) times daily as needed (runny nose, drainage). Use in each nostril as directed 11/18/20  Yes Ellamae Sia, DO  fexofenadine (ALLEGRA) 60 MG tablet Take 60 mg by mouth 2 (two) times daily.   Yes [provider]  fluticasone (FLONASE) 50 MCG/ACT nasal spray Place 2 sprays into both nostrils daily. 10/29/20  Yes Covington, Maralyn Sago M, PA-C  montelukast (SINGULAIR) 10 MG tablet TAKE 1 TABLET(10 MG) BY MOUTH AT BEDTIME 01/26/21  Yes Ellamae Sia, DO  ondansetron (ZOFRAN-ODT) 4 MG disintegrating tablet SMARTSIG:2 Tablet(s) Sublingual Twice Daily PRN 05/06/21  Yes [provider]  promethazine-dextromethorphan (PROMETHAZINE-DM) 6.25-15 MG/5ML syrup Take 5 mLs by mouth 4 (four) times daily as needed for cough. 07/10/21  Yes Lurline Idol, FNP  topiramate (TOPAMAX) 50 MG tablet Take 2 tablets (100 mg total) by mouth at bedtime. 06/22/21  Yes Levert Feinstein, MD  etonogestrel [facility])  68 MG IMPL implant 1 each by Subdermal route once.    [provider]  propranolol (INDERAL) 10 MG tablet Take 1 tablet (10 mg total) by mouth 2 (two) times daily. Patient not taking: Reported on 07/10/2021 05/07/21   Maury Dus, MD  triamcinolone (KENALOG) 0.1 % paste Use as directed 1 application in the mouth or throat 2 (two) times daily. 10/01/20   Derrel Nip, MD  Ubrogepant (UBRELVY) 50 MG TABS Take 1-2 tablets  30 minutes prior to MRI, may repeat once as needed. Must have driver. 06/22/21   Levert Feinstein, MD    Family History Family History  Problem Relation Age of Onset   Healthy Mother    Allergic rhinitis Mother    Celiac disease Father    Allergic rhinitis Father     Social History Social History   Tobacco Use   Smoking status: Never   Smokeless tobacco: Never  Vaping Use   Vaping Use: Never used  Substance Use Topics   Alcohol use: Never   Drug use: Never     Allergies   Sulfa antibiotics   Review of Systems Review of Systems  Constitutional:  Positive for appetite change and fever.  HENT:  Positive for congestion and rhinorrhea. Negative for ear pain, sneezing and sore throat.   Respiratory:  Positive for cough. Negative for shortness of breath and wheezing.   Gastrointestinal:  Positive for diarrhea, nausea and vomiting. Negative for abdominal pain.  Musculoskeletal:  Negative for myalgias.  Neurological:  Positive for headaches.  All other systems reviewed and are negative.   Physical Exam Triage Vital Signs ED Triage Vitals  Enc Vitals Group     BP 07/10/21 1356 116/75     Pulse Rate 07/10/21 1356 93     Resp 07/10/21 1356 20     Temp 07/10/21 1356 98.6 F (37 C)     Temp Source 07/10/21 1356 Oral     SpO2 07/10/21 1356 98 %     Weight --      Height --      Head Circumference --      Peak Flow --      Pain Score 07/10/21 1352 6     Pain Loc --      Pain Edu? --      Excl. in GC? --    No data found.  Updated Vital Signs BP 116/75 (BP Location: Right Arm)   Pulse 93   Temp 98.6 F (37 C) (Oral)   Resp 20   SpO2 98%   Visual Acuity Right Eye Distance:   Left Eye Distance:   Bilateral Distance:    Right Eye Near:   Left Eye Near:    Bilateral Near:     Physical Exam Vitals reviewed.  Constitutional:      General: She is not in acute distress.    Appearance: Normal appearance. She is not ill-appearing, toxic-appearing or diaphoretic.   HENT:     Head: Normocephalic.     Nose: Congestion present.     Mouth/Throat:     Mouth: Mucous membranes are moist.  Eyes:     Conjunctiva/sclera: Conjunctivae normal.  Cardiovascular:     Rate and Rhythm: Normal rate.  Pulmonary:     Effort: Pulmonary effort is normal.     Breath sounds: Normal breath sounds.  Abdominal:     Palpations: Abdomen is soft.  Musculoskeletal:        General: Normal range of motion.  Cervical back: Normal range of motion and neck supple.  Skin:    General: Skin is warm and dry.  Neurological:     General: No focal deficit present.     Mental Status: She is alert and oriented to person, place, and time.     UC Treatments / Results  Labs (all labs ordered are listed, but only abnormal results are displayed) Labs Reviewed  POC INFLUENZA A AND B ANTIGEN (URGENT CARE ONLY)    EKG   Radiology No results found.  Procedures Procedures (including critical care time)  Medications Ordered in UC Medications - No data to display  Initial Impression / Assessment and Plan / UC Course  I have reviewed the triage vital signs and the nursing notes.  Pertinent labs & imaging results that were available during my care of the patient were reviewed by me and considered in my medical decision making (see chart for details).     20 yo female presenting with a three day history of URI symptoms.  Patient is currently afebrile.  Nontoxic.  Vital signs stable.  Flu testing negative.  Home COVID test negative.   Plan:  Discussed diagnosis and treatment of URI. Discussed the importance of avoiding unnecessary antibiotic therapy. Suggested symptomatic OTC remedies. Nasal saline spray for congestion. Follow up as needed.  Today's evaluation has revealed no signs of a dangerous process. Discussed diagnosis with patient and/or guardian. Patient and/or guardian aware of their diagnosis, possible red flag symptoms to watch out for and need for close follow  up. Patient and/or guardian understands verbal and written discharge instructions. Patient and/or guardian comfortable with plan and disposition.  Patient and/or guardian has a clear mental status at this time, good insight into illness (after discussion and teaching) and has clear judgment to make decisions regarding their care  This care was provided during an unprecedented National Emergency due to the Novel Coronavirus (COVID-19) pandemic. COVID-19 infections and transmission risks place heavy strains on healthcare resources.  As this pandemic evolves, our facility, providers, and staff strive to respond fluidly, to remain operational, and to provide care relative to available resources and information. Outcomes are unpredictable and treatments are without well-defined guidelines. Further, the impact of COVID-19 on all aspects of urgent care, including the impact to patients seeking care for reasons other than COVID-19, is unavoidable during this national emergency. At this time of the global pandemic, management of patients has significantly changed, even for non-COVID positive patients given high local and regional COVID volumes at this time requiring high healthcare system and resource utilization. The standard of care for management of both COVID suspected and non-COVID suspected patients continues to change rapidly at the local, regional, national, and global levels. This patient was worked up and treated to the best available but ever changing evidence and resources available at this current time.   Documentation was completed with the aid of voice recognition software. Transcription may contain typographical errors.  Final Clinical Impressions(s) / UC Diagnoses   Final diagnoses:  Viral URI with cough     Discharge Instructions      Your symptoms are likely due to a viral respiratory infection. A respiratory infection is an illness that affects part of the respiratory system, such as the  lungs, nose, or throat. Antibiotic medicines are not prescribed for viral infections. This is because antibiotics are designed to kill bacteria. They do not kill viruses. You may take tylenol or ibuprofen as needed for fevers/headache/body aches. Drink plenty of  fluids.   Go to the ED immediately if you get worse or have any other symptoms.    Feel better soon!  Lelon Mast, FNP-C        ED Prescriptions     Medication Sig Dispense Auth. Provider   promethazine-dextromethorphan (PROMETHAZINE-DM) 6.25-15 MG/5ML syrup Take 5 mLs by mouth 4 (four) times daily as needed for cough. 118 mL Lurline Idol, FNP      PDMP not reviewed this encounter.   Lurline Idol, [place] 07/10/21 [phone]

## 2021-07-10 NOTE — Discharge Instructions (Signed)
Your symptoms are likely due to a viral respiratory infection. A respiratory infection is an illness that affects part of the respiratory system, such as the lungs, nose, or throat. Antibiotic medicines are not prescribed for viral infections. This is because antibiotics are designed to kill bacteria. They do not kill viruses. You may take tylenol or ibuprofen as needed for fevers/headache/body aches. Drink plenty of fluids.   Go to the ED immediately if you get worse or have any other symptoms.    Feel better soon!  Lelon Mast, FNP-C

## 2021-07-10 NOTE — ED Notes (Signed)
Patient questioned if she was also getting prednisone from pharmacy.relayed question to provider.  Prednisone was added to instructions .

## 2021-07-10 NOTE — ED Triage Notes (Signed)
Thursday started with sore throat.  Since then has developed cough and runny nose, headache, nausea, one episode of vomiting, several episodes of loose stool

## 2021-08-04 ENCOUNTER — Other Ambulatory Visit: Payer: Self-pay | Admitting: Family Medicine

## 2021-08-04 DIAGNOSIS — G43109 Migraine with aura, not intractable, without status migrainosus: Secondary | ICD-10-CM

## 2021-08-08 ENCOUNTER — Telehealth: Payer: Self-pay

## 2021-08-08 NOTE — Telephone Encounter (Signed)
Patient calls nurse line reporting she is traveling to [place] on 1/29 and needs typhoid and possible malaria vaccinations. Patient reports concerns with her insurance not covering these vaccines and general questions on how to prepare. Patient advised to call the health department, as they specialize in global travel.   Patient calls back stating she called them, however they could not answer any questions regarding her trip without and apt. Patient reports their next available apt is not until after she returns from [place].   Patient scheduled for 1/9 at [facility].

## 2021-08-16 ENCOUNTER — Telehealth: Payer: Self-pay | Admitting: Allergy

## 2021-08-16 NOTE — Telephone Encounter (Signed)
Pts was suppose to come back in may 2022 she canceled her appt and we have denied it several times. I informed pt that she would have to be seen but I could send in a curtesy refill if she makes an appt now. She said she would call pcp about the refill as she is leaving soon to go out of the country for 2 months. I told her if she changed her mind on the refill to let us know.

## 2021-08-16 NOTE — Telephone Encounter (Signed)
Patient is requesting a refill for montelukast. She called her pharmacy and they told her to call us because she would get it faster than them sending [place] a fax. [facility] [address].

## 2021-08-28 NOTE — Progress Notes (Addendum)
° ° °  SUBJECTIVE:   CHIEF COMPLAINT / HPI:   Traveling Overseas Barbara Torres is a 21 y.o. female who presents to the clinic today to discuss upcoming trip to [place].  She will be traveling from January 29 to March 26 for school.  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  States that she will be staying mostly in the bigger city but also traveling to rural villages with 2 of her other classmates.  Wondering if she needs medication for prevention of malaria.   PERTINENT  PMH / PSH:  Past Medical History:  Diagnosis Date   Allergies    Asthma    Celiac disease    Eczema    IgA deficiency (HCC)     OBJECTIVE:   BP 133/68    Pulse 90    Wt 137 lb 8 oz (62.4 kg)    LMP 08/22/2021    SpO2 98%    BMI 20.91 kg/m    General: NAD, pleasant, able to participate in exam Respiratory: Normal work of breathing on room air Neuro: alert, answers questions appropriately Psych: Normal affect and mood  ASSESSMENT/PLAN:   Encounter for health counseling related to travel Per the CDC website, routine vaccinations are recommended along with Hepatitis A and typhoid vaccines (if staying with friends, family, or visiting rural areas). Patient is up-to-date on routine childhood vaccinations. -Discussed that she could get the typhoid vaccine at the health department -Per CDC, mosquito avoidance is recommended but no medications are recommended for prophylaxis of maleria  Healthcare maintenance Declines HIV and hepatitis C screening today.  Patient is not sexually active thus has not yet had Pap.   Medication refill Requests refill on albuterol and Singulair (90-day supply given she will be at the country for a couple months). She does not use her albuterol inhaler often and actually requests a refill as her previous inhaler is about to expire. -Refilled both albuterol inhaler and Singulair    Sabino Dick, DO [facility]

## 2021-08-29 ENCOUNTER — Ambulatory Visit: Payer: Managed Care, Other (non HMO) | Admitting: Family Medicine

## 2021-08-29 ENCOUNTER — Encounter: Payer: Self-pay | Admitting: Family Medicine

## 2021-08-29 ENCOUNTER — Other Ambulatory Visit: Payer: Self-pay

## 2021-08-29 DIAGNOSIS — Z760 Encounter for issue of repeat prescription: Secondary | ICD-10-CM | POA: Diagnosis not present

## 2021-08-29 DIAGNOSIS — Z0000 Encounter for general adult medical examination without abnormal findings: Secondary | ICD-10-CM | POA: Insufficient documentation

## 2021-08-29 DIAGNOSIS — Z7184 Encounter for health counseling related to travel: Secondary | ICD-10-CM | POA: Diagnosis not present

## 2021-08-29 MED ORDER — MONTELUKAST SODIUM 10 MG PO TABS: 10.0000 mg | ORAL_TABLET | Freq: Every day | ORAL | 1 refills | Status: AC

## 2021-08-29 MED ORDER — ALBUTEROL SULFATE HFA 108 (90 BASE) MCG/ACT IN AERS: 2.0000 | INHALATION_SPRAY | Freq: Four times a day (QID) | RESPIRATORY_TRACT | 2 refills | Status: AC | PRN

## 2021-08-29 NOTE — Assessment & Plan Note (Signed)
Requests refill on albuterol and Singulair (90-day supply given she will be at the country for a couple months). She does not use her albuterol inhaler often and actually requests a refill as her previous inhaler is about to expire. -Refilled both albuterol inhaler and Singulair

## 2021-08-29 NOTE — Assessment & Plan Note (Addendum)
Per the CDC website, routine vaccinations are recommended along with Hepatitis A and typhoid vaccines (if staying with friends, family, or visiting rural areas). Patient is up-to-date on routine childhood vaccinations. -Discussed that she could get the typhoid vaccine at the health department -Per CDC, mosquito avoidance is recommended but no medications are recommended for prophylaxis of maleria

## 2021-08-29 NOTE — Patient Instructions (Addendum)
It was wonderful to see you today.  Today we talked about:  -You are up to date on your routine vaccines.  -Per the CDC, the only other vaccines that would be recommended would be the Typhoid vaccine which you can get at the health department.  -I've refilled your Albuterol inhaler. Use this as needed for wheezing or shortness of breath. -I've sent in a 90-day supply of your Singulair.  -ENJOY and BE SAFE!    Thank you for choosing [facility].   Please call [phone] with any questions about today's appointment.  Please be sure to schedule follow up at the front  desk before you leave today.   Sabino Dick, DO PGY-2 Family Medicine

## 2021-08-29 NOTE — Assessment & Plan Note (Signed)
Declines HIV and hepatitis C screening today.  Patient is not sexually active thus has not yet had Pap.

## 2021-11-23 ENCOUNTER — Encounter: Payer: Self-pay | Admitting: Family Medicine

## 2021-11-23 ENCOUNTER — Ambulatory Visit: Payer: Managed Care, Other (non HMO) | Admitting: Family Medicine

## 2021-11-23 VITALS — BP 104/60 | HR 100 | Ht 68.0 in | Wt 129.6 lb

## 2021-11-23 DIAGNOSIS — J392 Other diseases of pharynx: Secondary | ICD-10-CM

## 2021-11-23 DIAGNOSIS — Z9109 Other allergy status, other than to drugs and biological substances: Secondary | ICD-10-CM

## 2021-11-23 DIAGNOSIS — H1013 Acute atopic conjunctivitis, bilateral: Secondary | ICD-10-CM

## 2021-11-23 MED ORDER — OLOPATADINE HCL 0.2 % OP SOLN: 1.0000 [drp] | Freq: Every day | OPHTHALMIC | 6 refills | Status: AC

## 2021-11-23 MED ORDER — FEXOFENADINE HCL 180 MG PO TABS: 180.0000 mg | ORAL_TABLET | Freq: Every day | ORAL | 1 refills | Status: AC

## 2021-11-23 NOTE — Assessment & Plan Note (Signed)
Suspect intermittent rhinorrhea, dry scratchy throat, dry eyes related to environmental allergies.  Patient already on Singulair, Allegra, Flonase, and Astelin nasal spray through allergist.  Will increase Allegra from 60 mg twice daily to 180 mg daily.  Rx Pataday eyedrops.  Return precautions given.  If requires further management, will be best served by allergist for more specialty medications. ?

## 2021-11-23 NOTE — Patient Instructions (Signed)
It was wonderful to see you today. Thank you for allowing me to be a part of your care. Below is a short summary of what we discussed at your visit today: ? ?Cough, dry itchy throat ?It looks like your symptoms are noninfectious and likely seasonal allergy in origin. ? ?Keep taking your 2 nasal sprays and Singulair as prescribed. ? ?I have increased your dose of Allegra.  You will now take 1 pill (180 mg) daily. ? ?I have prescribed an allergy eyedrop called Pataday.  You will do 1 drop in each eye daily. ? ?If your symptoms persist despite these medication changes, please contact your allergist.  Unfortunately, you are already on many of the allergy medications that family medicine is comfortable prescribing.  For further care, you may need more specialty medications. ? ?Please bring all of your medications to every appointment! ? ?If you have any questions or concerns, please do not hesitate to contact us via phone or MyChart message.  ? ?Fayette Pho, MD  ? ?Below are excellent community resources I want every one to know about:  ? ?Cooking and Nutrition Classes ?[employer] in [place] provides many classes at low or no cost to [employer], nutrition, and agriculture.  Their website offers a huge variety of information related to topics such as gardening, nutrition, cooking, parenting, and health.  Also listed are classes and events, both online and in-person.  Check out their website here: [email or web address]  ? ?Food finder app ?Download the [employer] App or Call 211 to easily find food banks and pantries and other resources nearby.  ? ?Employment / Personnel officer ?[employer]: [phone] / [address] Human resources officer (Client preference), Accepting New clients  ?[employer] Surveyor, minerals ([employer]): 7478044435 ([place]) / [phone] ([place]) Virtual & Onsite workshops, Accepting new clients  ?[employer] Engineer, maintenance (IT) Center: [phone] / [phone] Virtual & Onsite , Accepting New clients  ?[employer] Career Center: [phone]    ?[employer] Work First: [phone] ([place]) / [phone] ([place]) Virtual, Accepting clients  ?[employer] [place]:  ([phone] Virtual and Onsite, Accepting new clients   ? ?Financial Assistance ?[employer]:  [phone] Virtual (financial assistance) & Onsite (all other services), Accepting new families  ?[employer]: [phone] Virtual  ?[employer] (furniture):  ([phone]   ?[place] [employer]: 3030296285   ?Low Income Energy Assistance  3431941347 Virtual, accepting new applicants  ? ?Food Assistance ?DHHS- SNAP/ Food Stamps: 581-161-6542 Virtual  ?WIC: [place](301) 696-3218 ;  [place] [phone]   ?[employer]- Free Meals   ?[employer]- [employer] Pantries   ?During the summer, text "FOOD" to [phone]   ? ?General Health / Clinics (Adults) ?[employer] (for Adults) through [employer]: [phone]   ?[facility]:   [phone]   ?[facility]:   [phone]   ?Health Department:  [phone] Onsite, accepting new patients  ?[facility] of [place]:   [phone] Onsite, accepting new clients  ?[facility]:   [phone] x [phone] Onsite, accepting new clients  ?  ?Housing ?[employer]:   8435187398  ?[employer] Authority:  (734)563-7024  ?Affordable Housing Management:  ([phone]  ?[employer] Shelter:  985-806-8328  ?Holiday representative / Center of [place]:  815-641-2692 / [phone]   ?[employer] Family Shelter:  [phone]  ?Housing ?The CARES Act temporarily banned evictions and  late fees  until July 25th (Saturday). Below are some resources and programs in [employer] for folks to look to for assistance with back payments of rent and other ways of getting help to remain  in their homes.  ?Additional Resources: ?[employer] (Flyers enclosed) ?[employer] copy (Flyer enclosed) ?[employer] [phone] ?[employer] [phone] ?[employer] [phone] ?Consulting civil engineer and Housing Assistance ?[employer] is primarily [employer].  GHC is [employer] only.  In [facility], [place] [address] and [employer] provide assistance.  The link shows some agencies providing assistance in other counties. If you are aware of others, please share.  ? ? ? ?

## 2021-11-23 NOTE — Progress Notes (Signed)
? ? ?  SUBJECTIVE:  ? ?CHIEF COMPLAINT / HPI:  ? 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 ?PERTINENT  PMH / PSH: IgA deficiency, celiac disease, asthma, environmental allergies ? ?OBJECTIVE:  ? ?BP 104/60   Pulse 100   Ht 5\' 8"  (1.727 m)   Wt 129 lb 9.6 oz (58.8 kg)   SpO2 98%   BMI 19.71 kg/m?   ? ?PHQ-9:  ? ?  11/23/2021  ? 10:18 AM 05/09/2021  ? 11:10 AM 01/14/2021  ? 10:18 AM  ?Depression screen PHQ 2/9  ?Decreased Interest 0 0 0  ?Down, Depressed, Hopeless 0 0 0  ?PHQ - 2 Score 0 0 0  ?Altered sleeping 0 0 1  ?Tired, decreased energy 0 1 1  ?Change in appetite 0 0 1  ?Feeling bad or failure about yourself  0 0 0  ?Trouble concentrating 0 1 1  ?Moving slowly or fidgety/restless 0 0 0  ?Suicidal thoughts 0 0 0  ?PHQ-9 Score 0 2 4  ? ?Physical Exam ?General: Awake, alert, oriented ?HEENT: PERRL, bilateral TM pearly pink and flat, bilateral external auditory canals with minimal cerumen burden, no lesions, nasal mucosa slightly edematous, oral mucosa pink, moist, without lesion, intact dentition without obvious cavity ?Lymph: Shoddy lymphedema of anterior neck ?Cardiovascular: Regular rate and rhythm, S1 and S2 present, no murmurs auscultated ?Respiratory: Lung fields clear to auscultation bilaterally ? ?ASSESSMENT/PLAN:  ? ?Environmental allergies ?Suspect intermittent rhinorrhea, dry scratchy throat, dry eyes related to environmental allergies.  Patient already on Singulair, Allegra, Flonase, and Astelin nasal spray through allergist.  Will increase Allegra from 60 mg twice daily to 180 mg daily.  Rx Pataday eyedrops.  Return precautions given.  If requires further management, will  be best served by allergist for more specialty medications. ?  ? ? ?4/9, MD ?[facility]  ?

## 2021-12-28 IMAGING — DX DG FINGER LITTLE 2+V*R*
3 series · 3 of 3 positions shown · non-contrast
Comparison: None.

CLINICAL DATA: Patient states that she injured her right 5th digit
in a car door x 3 days ago. Pain along distal end of finger. No Hx

EXAM:
RIGHT LITTLE FINGER 2+V

[finger ap]
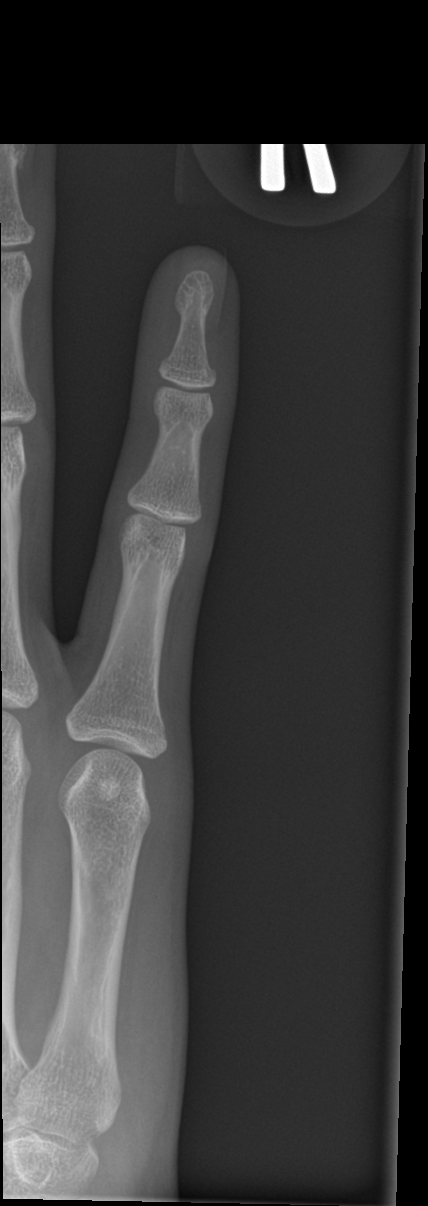

[finger obl]
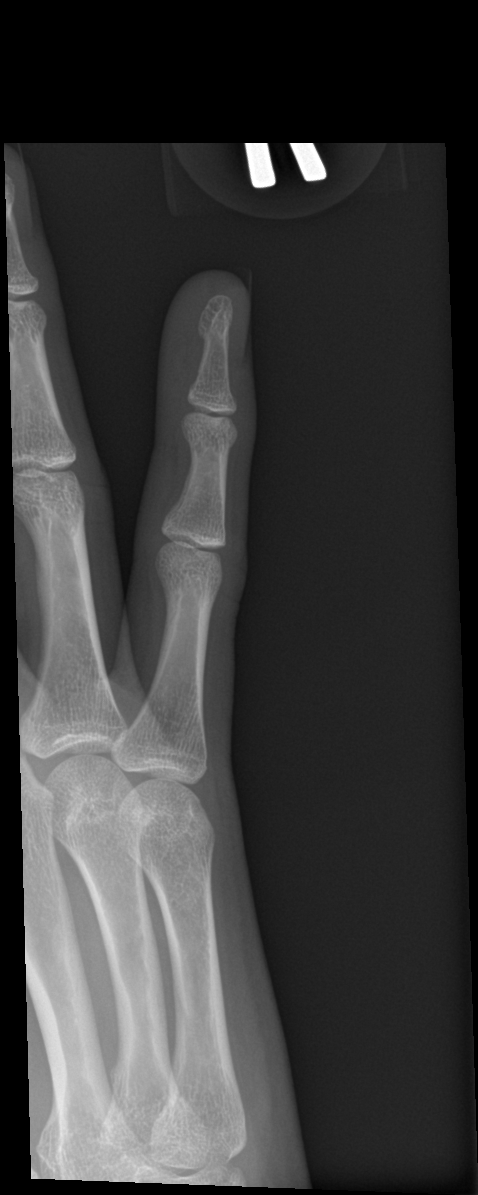

[finger lat]
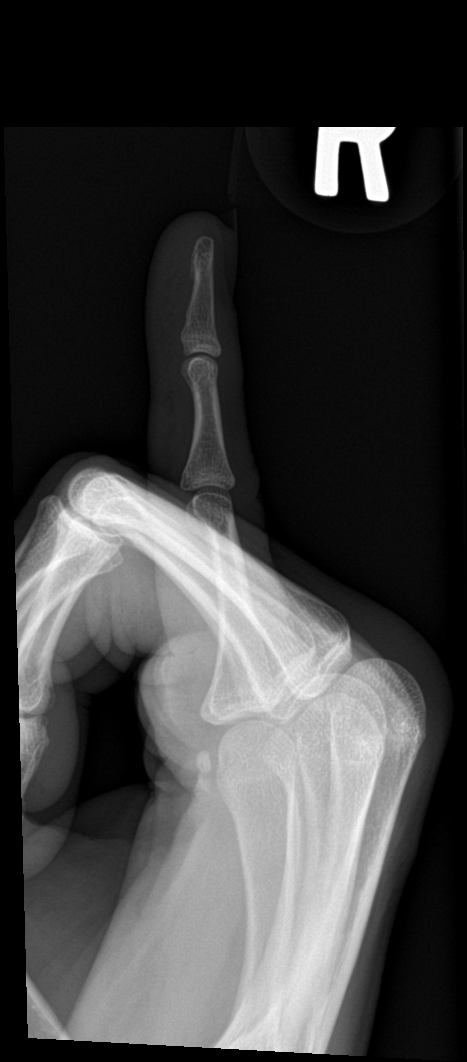

[3 of 3 positions shown; findings below may reference images not displayed]

FINDINGS: There is no evidence of fracture or dislocation. There is no
evidence of arthropathy or other focal bone abnormality. Soft
tissues are unremarkable.
IMPRESSION: Negative.

## 2022-01-24 ENCOUNTER — Encounter: Payer: Self-pay | Admitting: *Deleted
